# Patient Record
Sex: Female | Born: 1980 | Hispanic: No | Marital: Married | Smoking: Never smoker
Health system: Southern US, Community
[De-identification: ages and names within clinical notes are randomized; demographics above are authoritative.]

## PROBLEM LIST (undated history)

## (undated) ENCOUNTER — Inpatient Hospital Stay (HOSPITAL_COMMUNITY): Payer: Self-pay

## (undated) DIAGNOSIS — K649 Unspecified hemorrhoids: Secondary | ICD-10-CM

## (undated) HISTORY — PX: CHOLECYSTECTOMY: SHX55

---

## 2008-05-27 ENCOUNTER — Ambulatory Visit: Payer: Self-pay | Admitting: Obstetrics and Gynecology

## 2008-05-28 ENCOUNTER — Encounter: Payer: Self-pay | Admitting: Obstetrics and Gynecology

## 2008-05-28 LAB — CONVERTED CEMR LAB
Eosinophils Relative: 3 % (ref 0–5)
HCT: 37 % (ref 36.0–46.0)
Hemoglobin: 11.7 g/dL — ABNORMAL LOW (ref 12.0–15.0)
Hepatitis B Surface Ag: NEGATIVE
Hgb A: 97.8 % (ref 96.8–97.8)
Hgb F Quant: 0 % (ref 0.0–2.0)
Hgb S Quant: 0 % (ref 0.0–0.0)
Lymphocytes Relative: 26 % (ref 12–46)
Lymphs Abs: 2.4 10*3/uL (ref 0.7–4.0)
Neutro Abs: 6.2 10*3/uL (ref 1.7–7.7)
Neutrophils Relative %: 66 % (ref 43–77)
Rubella: 115.8 intl units/mL — ABNORMAL HIGH
WBC: 9.4 10*3/uL (ref 4.0–10.5)

## 2008-06-21 ENCOUNTER — Ambulatory Visit: Payer: Self-pay | Admitting: Family

## 2008-06-25 ENCOUNTER — Ambulatory Visit (HOSPITAL_COMMUNITY): Admission: RE | Admit: 2008-06-25 | Discharge: 2008-06-25 | Payer: Self-pay | Admitting: Obstetrics & Gynecology

## 2008-07-19 ENCOUNTER — Ambulatory Visit: Payer: Self-pay | Admitting: Family

## 2008-08-16 ENCOUNTER — Ambulatory Visit: Payer: Self-pay | Admitting: Family

## 2008-09-13 ENCOUNTER — Ambulatory Visit: Payer: Self-pay | Admitting: Obstetrics and Gynecology

## 2008-09-13 LAB — CONVERTED CEMR LAB
HCT: 33.4 % — ABNORMAL LOW (ref 36.0–46.0)
Hemoglobin: 10.5 g/dL — ABNORMAL LOW (ref 12.0–15.0)
MCHC: 31.4 g/dL (ref 30.0–36.0)
RBC: 3.89 M/uL (ref 3.87–5.11)
WBC: 11.8 10*3/uL — ABNORMAL HIGH (ref 4.0–10.5)

## 2008-09-24 ENCOUNTER — Inpatient Hospital Stay (HOSPITAL_COMMUNITY): Admission: AD | Admit: 2008-09-24 | Discharge: 2008-09-24 | Payer: Self-pay | Admitting: Obstetrics & Gynecology

## 2008-09-24 ENCOUNTER — Ambulatory Visit: Payer: Self-pay | Admitting: Advanced Practice Midwife

## 2008-09-27 ENCOUNTER — Ambulatory Visit: Payer: Self-pay | Admitting: Physician Assistant

## 2008-10-04 ENCOUNTER — Encounter: Payer: Self-pay | Admitting: Physician Assistant

## 2008-10-11 ENCOUNTER — Ambulatory Visit: Payer: Self-pay | Admitting: Physician Assistant

## 2008-10-25 ENCOUNTER — Ambulatory Visit: Payer: Self-pay | Admitting: Physician Assistant

## 2008-10-25 ENCOUNTER — Ambulatory Visit (HOSPITAL_COMMUNITY): Admission: RE | Admit: 2008-10-25 | Discharge: 2008-10-25 | Payer: Self-pay | Admitting: Obstetrics & Gynecology

## 2008-10-26 ENCOUNTER — Encounter: Payer: Self-pay | Admitting: Physician Assistant

## 2008-10-28 ENCOUNTER — Ambulatory Visit: Payer: Self-pay | Admitting: Family

## 2008-11-01 ENCOUNTER — Ambulatory Visit: Payer: Self-pay | Admitting: Obstetrics and Gynecology

## 2008-11-06 ENCOUNTER — Inpatient Hospital Stay (HOSPITAL_COMMUNITY): Admission: AD | Admit: 2008-11-06 | Discharge: 2008-11-08 | Payer: Self-pay | Admitting: Obstetrics and Gynecology

## 2008-11-06 ENCOUNTER — Ambulatory Visit: Payer: Self-pay | Admitting: Physician Assistant

## 2008-12-06 ENCOUNTER — Ambulatory Visit: Payer: Self-pay | Admitting: Obstetrics and Gynecology

## 2008-12-06 LAB — CONVERTED CEMR LAB
AST: 61 units/L — ABNORMAL HIGH (ref 0–37)
Albumin: 4.5 g/dL (ref 3.5–5.2)
Amylase: 69 units/L (ref 0–105)
BUN: 15 mg/dL (ref 6–23)
CO2: 21 meq/L (ref 19–32)
Glucose, Bld: 81 mg/dL (ref 70–99)
MCHC: 30 g/dL (ref 30.0–36.0)
Potassium: 4.7 meq/L (ref 3.5–5.3)
RDW: 17.1 % — ABNORMAL HIGH (ref 11.5–15.5)
Sodium: 140 meq/L (ref 135–145)
Total Bilirubin: 0.5 mg/dL (ref 0.3–1.2)

## 2008-12-20 ENCOUNTER — Ambulatory Visit: Payer: Self-pay | Admitting: Physician Assistant

## 2008-12-30 ENCOUNTER — Ambulatory Visit: Payer: Self-pay | Admitting: Family Medicine

## 2008-12-30 DIAGNOSIS — R1013 Epigastric pain: Secondary | ICD-10-CM | POA: Insufficient documentation

## 2008-12-31 ENCOUNTER — Encounter: Admission: RE | Admit: 2008-12-31 | Discharge: 2008-12-31 | Payer: Self-pay | Admitting: Family Medicine

## 2008-12-31 DIAGNOSIS — K8020 Calculus of gallbladder without cholecystitis without obstruction: Secondary | ICD-10-CM | POA: Insufficient documentation

## 2009-01-01 ENCOUNTER — Telehealth: Payer: Self-pay | Admitting: Family Medicine

## 2009-01-08 ENCOUNTER — Encounter: Payer: Self-pay | Admitting: Family Medicine

## 2009-04-18 ENCOUNTER — Ambulatory Visit: Payer: Self-pay | Admitting: Family Medicine

## 2009-10-22 ENCOUNTER — Ambulatory Visit: Payer: Self-pay | Admitting: Family Medicine

## 2009-10-22 DIAGNOSIS — H6690 Otitis media, unspecified, unspecified ear: Secondary | ICD-10-CM | POA: Insufficient documentation

## 2009-10-28 ENCOUNTER — Telehealth: Payer: Self-pay | Admitting: Family Medicine

## 2009-12-15 ENCOUNTER — Ambulatory Visit: Payer: Self-pay | Admitting: Family Medicine

## 2009-12-15 DIAGNOSIS — J309 Allergic rhinitis, unspecified: Secondary | ICD-10-CM | POA: Insufficient documentation

## 2009-12-15 DIAGNOSIS — H6120 Impacted cerumen, unspecified ear: Secondary | ICD-10-CM | POA: Insufficient documentation

## 2010-01-18 ENCOUNTER — Telehealth (INDEPENDENT_AMBULATORY_CARE_PROVIDER_SITE_OTHER): Payer: Self-pay | Admitting: *Deleted

## 2010-01-18 ENCOUNTER — Ambulatory Visit: Payer: Self-pay | Admitting: Emergency Medicine

## 2010-01-18 DIAGNOSIS — H60399 Other infective otitis externa, unspecified ear: Secondary | ICD-10-CM | POA: Insufficient documentation

## 2010-03-22 ENCOUNTER — Encounter: Payer: Self-pay | Admitting: Obstetrics & Gynecology

## 2010-03-30 ENCOUNTER — Ambulatory Visit
Admission: RE | Admit: 2010-03-30 | Discharge: 2010-03-30 | Payer: Self-pay | Source: Home / Self Care | Attending: Family Medicine | Admitting: Family Medicine

## 2010-03-30 DIAGNOSIS — J019 Acute sinusitis, unspecified: Secondary | ICD-10-CM | POA: Insufficient documentation

## 2010-03-31 NOTE — Progress Notes (Signed)
Summary: Ear pain  Phone Note Call from Patient   Caller: Spouse Summary of Call: Pt has been taking ABX for 4 days but is still having ear pain. She has been taking IBU every 6 hours but it is not helping. Pt would like to know if you can send something for pain to pharm. Please advise. Initial call taken by: Payton Spark CMA,  October 28, 2009 9:21 AM    New/Updated Medications: ACETAMINOPHEN-CODEINE #3 300-30 MG TABS (ACETAMINOPHEN-CODEINE) 1 tab by mouth q 4-6 hrs as needed pain; take with food Prescriptions: ACETAMINOPHEN-CODEINE #3 300-30 MG TABS (ACETAMINOPHEN-CODEINE) 1 tab by mouth q 4-6 hrs as needed pain; take with food  #24 x 0   Entered and Authorized by:   Seymour Bars DO   Signed by:   Seymour Bars DO on 10/28/2009   Method used:   Printed then faxed to ...       [address] [phone]* (retail)       [address] [place], [place]  [address]       Ph: [phone]       Fax: [phone]   RxID:   [id number]   Appended Document: Ear pain LMOM informing Pt of the above

## 2010-03-31 NOTE — Progress Notes (Signed)
Summary: RX verification  Phone Note From Pharmacy   Summary of Call: [facility] [place], [place] called to verify RX for pt.s ear, RX states ointment and directions are for drops. Please verify and send appropiate RX to [facility]. Joanne Chars CMA  January 18, 2010 4:13 PM   Follow-up for Phone Call        Yes, I meant solution, not ointment

## 2010-03-31 NOTE — Assessment & Plan Note (Signed)
Summary: EAR PAIN/TM   Vital Signs:  Patient Profile:   30 Years Old Female CC:      Rt ear pain x 3 days Height:     64 inches Weight:      153 pounds O2 Sat:      99 % O2 treatment:    Room Air Temp:     98.7 degrees F oral Pulse rate:   62 / minute Pulse rhythm:   regular Resp:     12 per minute BP sitting:   111 / 70  (left arm) Cuff size:   regular  Vitals Entered By: Emilio Math (January 18, 2010 1:24 PM)                  Current Allergies (reviewed today): No known allergies History of Present Illness Chief Complaint: Rt ear pain x 3 days History of Present Illness: R ear pain for 3 days.  She had something similar a few months ago, and when ears were flushed she felt better.  Patient with HA, sinus congestion.  Hurts to touch ear.  No drainage.  Current Meds TYLENOL 325 MG TABS (ACETAMINOPHEN) 1 prn IBUPROFEN 200 MG TABS (IBUPROFEN) 1-2 prn CORTISPORIN 1 % OINT (BACIT-POLY-NEO HC) 4 drops R ear two times a day for 10 days  REVIEW OF SYSTEMS Constitutional Symptoms      Denies fever, chills, night sweats, weight loss, weight gain, and fatigue.  Eyes       Denies change in vision, eye pain, eye discharge, glasses, contact lenses, and eye surgery. Ear/Nose/Throat/Mouth       Complains of ear pain.      Denies hearing loss/aids, change in hearing, ear discharge, dizziness, frequent runny nose, frequent nose bleeds, sinus problems, sore throat, hoarseness, and tooth pain or bleeding.  Respiratory       Denies dry cough, productive cough, wheezing, shortness of breath, asthma, bronchitis, and emphysema/COPD.  Cardiovascular       Denies murmurs, chest pain, and tires easily with exhertion.    Gastrointestinal       Denies stomach pain, nausea/vomiting, diarrhea, constipation, blood in bowel movements, and indigestion. Genitourniary       Denies painful urination, kidney stones, and loss of urinary control. Neurological       Complains of headaches.       Denies paralysis, seizures, and fainting/blackouts. Musculoskeletal       Denies muscle pain, joint pain, joint stiffness, decreased range of motion, redness, swelling, muscle weakness, and gout.  Skin       Denies bruising, unusual mles/lumps or sores, and hair/skin or nail changes.  Psych       Denies mood changes, temper/anger issues, anxiety/stress, speech problems, depression, and sleep problems.  Past History:  Past Medical History: Reviewed history from 12/30/2008 and no changes required. G2P2  Past Surgical History:  Cholecystectomy  Family History: Reviewed history from 12/30/2008 and no changes required. [place]rill  Social History: Reviewed history from 12/30/2008 and no changes required. 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  Never smoked.  Denies ETOH. Does not exercise. Physical Exam General appearance: well developed, well nourished, no acute distress Nasal: mucosa pink, nonedematous, no septal deviation, turbinates normal Oral/Pharynx: tongue normal, posterior pharynx without erythema or exudate Chest/Lungs: no rales, wheezes, or rhonchi bilateral, breath sounds equal without effort Heart: regular rate and  rhythm, no murmur Skin: no obvious rashes or lesions MSE: oriented to time, place, and person L ear normal R ear obstructed by cerumen  and purulent discharge.  Tragus TTP.  After flushing, a lot of cerumen and pus comes out.  TM is intact and no erythema.  Canal still with mild swelling. Assessment New Problems: CERUMEN IMPACTION, RIGHT (ICD-380.4) OTITIS EXTERNA, RIGHT (ICD-380.10)   Patient Education: Patient and/or caregiver instructed in the following: rest, fluids, Ibuprofen prn.  Plan New Medications/Changes: CORTISPORIN 1 % OINT (BACIT-POLY-NEO HC) 4 drops R ear two times a day for 10 days  #QS x 0, 01/18/2010, Hoyt Koch MD  New Orders: New Patient Level III [phone] Cerumen Impaction Removal [69210] Planning Comments:   Use eardrops as directed If  not improving, follow up with PCP or ENT Tylenol/Ibuprofen for pain   The patient and/or caregiver has been counseled thoroughly with regard to medications prescribed including dosage, schedule, interactions, rationale for use, and possible side effects and they verbalize understanding.  Diagnoses and expected course of recovery discussed and will return if not improved as expected or if the condition worsens. Patient and/or caregiver verbalized understanding.  Prescriptions: CORTISPORIN 1 % OINT (BACIT-POLY-NEO HC) 4 drops R ear two times a day for 10 days  #QS x 0   Entered and Authorized by:   Hoyt Koch MD   Signed by:   Hoyt Koch MD on 01/18/2010   Method used:   Print then Give to Patient   RxID:   [id number]   Orders Added: 1)  New Patient Level III [95621] 2)  Cerumen Impaction Removal [30865]

## 2010-03-31 NOTE — Assessment & Plan Note (Signed)
Summary: AOM   Vital Signs:  Patient profile:   30 year old female Height:      64 inches Weight:      156 pounds BMI:     26.87 O2 Sat:      98 % on Room air Temp:     98.1 degrees F oral Pulse rate:   63 / minute BP sitting:   101 / 66  (left arm) Cuff size:   regular  Vitals Entered By: Payton Spark CMA (December 15, 2009 8:25 AM)  O2 Flow:  Room air CC: L ear infection x days.   Primary Care Provider:  Seymour Bars DO  CC:  L ear infection x days.Marland Kitchen  History of Present Illness: 30 yo F presents for L ear pain x 1-2 wks.  Has constant pain in the ear and she feels like something is in her ear.  She has some ringing in the ear.  Has some muffled hearing and some dizziness.  She has also has head congestion..  No sore throat, fevers or chills.  Denies any dental pain.  Has some fatigue.    Current Medications (verified): 1)  None  Allergies (verified): No Known Drug Allergies  Past History:  Past Medical History: Reviewed history from 12/30/2008 and no changes required. G2P2  Social History: Reviewed history from 12/30/2008 and no changes required. 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  Never smoked.  Denies ETOH. Does not exercise.  Review of Systems      See HPI  Physical Exam  General:  alert, well-developed, well-nourished, and well-hydrated.   Head:  normocephalic and atraumatic.  sinuses NTTP Eyes:  allergic shiners; conjunctiva clear; no lid edema Ears:  bilat cerumen impaction, deep.  no external tenderness.  obstructed TMs. successfully irrigated the L side --> purulence at the TM, poor bony landmarks Nose:  nasal congestion with clear rhinorrhea and boggy turbinates Mouth:  2+ tonsilar hypertrophy with slight injection and cobblestoning Neck:  no masses.   Lungs:  Normal respiratory effort, chest expands symmetrically. Lungs are clear to auscultation, no crackles or wheezes. Heart:  Normal rate and regular rhythm. S1 and S2 normal without gallop, murmur,  click, rub or other extra sounds. Skin:  color normal and no rashes.   Cervical Nodes:  No lymphadenopathy noted   Impression & Recommendations:  Problem # 1:  OTITIS MEDIA, LEFT (ICD-382.9) L AOM present after cerumen irrigated.  This is her 2nd AOM this year.  Will refer to ENT and treat current infection with Cefdinir and Ibuprofen.   The following medications were removed from the medication list:    Amoxicillin-pot Clavulanate 875-125 Mg Tabs (Amoxicillin-pot clavulanate) .Marland Kitchen... 1 tab by mouth q 12 hrs x 10 days; take with food Her updated medication list for this problem includes:    Cefdinir 300 Mg Caps (Cefdinir) .Marland Kitchen... 1 capsule by mouth two times a day x 10 days  Problem # 2:  CERUMEN IMPACTION, BILATERAL (ICD-380.4) Successfully irrigated L side but deep impaction remains on the R.  Avoid Q tips. ENT referral made for microscopic removal.  Problem # 3:  ALLERGIC RHINITIS CAUSE UNSPECIFIED (ICD-477.9) Recommend  claritin D once daily thru Fall.  Complete Medication List: 1)  Claritin-d 24 Hour 10-240 Mg Xr24h-tab (Loratadine-pseudoephedrine) .Marland Kitchen.. 1 tab by mouth qam 2)  Cefdinir 300 Mg Caps (Cefdinir) .Marland Kitchen.. 1 capsule by mouth two times a day x 10 days  Patient Instructions: 1)  Take Cefdinir 1 capsule 2 x a day (with breakfast  and dinner) x 10 days for L ear infection. 2)  Referral to ENT made for recurrent ear infections and wax impaction on the R. 3)  Take OTC Claritin D daily for allergies thru Fall. 4)  Use Advil as needed for ear pain. Prescriptions: CEFDINIR 300 MG CAPS (CEFDINIR) 1 capsule by mouth two times a day x 10 days  #20 x 0   Entered and Authorized by:   Seymour Bars DO   Signed by:   Seymour Bars DO on 12/15/2009   Method used:   Electronically to        [facility] [phone]* (retail)       [address] [place], [place]  [address]       Ph: [phone]       Fax: [phone]   RxID:   [id number]    Orders Added: 1)  Est. Patient Level IV  [[address]]  Appended Document: AOM

## 2010-03-31 NOTE — Assessment & Plan Note (Signed)
Summary: AOM   Vital Signs:  Patient profile:   30 year old female Height:      64 inches Weight:      157 pounds BMI:     27.05 O2 Sat:      99 % on Room air Temp:     98.5 degrees F oral Pulse rate:   84 / minute BP sitting:   96 / 65  (left arm) Cuff size:   regular  Vitals Entered By: Payton Spark CMA (October 22, 2009 2:38 PM)  O2 Flow:  Room air CC: Ear pain x 1 week   Primary Care Provider:  Seymour Bars DO  CC:  Ear pain x 1 week.  History of Present Illness: R>L ear pain on and off thru the years but worse the past wk.  She has some head congestion and sore throat.  She has not been diagnosed with allergies.    Denies fevers, chills or drainage from her ears.  Denies dizziness but has muffled hearing.  No tinnitus.  Allergies: No Known Drug Allergies  Past History:  Past Medical History: Reviewed history from 12/30/2008 and no changes required. G2P2  Social History: Reviewed history from 12/30/2008 and no changes required. 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  Never smoked.  Denies ETOH. Does not exercise.  Review of Systems      See HPI  Physical Exam  General:  alert, well-developed, well-nourished, and well-hydrated.   Head:  normocephalic and atraumatic.   Eyes:  sclera non icteric strabismus Ears:  R TM appears opague with poor light reflex and L TM is patent with purulent drainage behind the TM with fluid bubbles Nose:  nasal congestion with boggy turbinates present Mouth:  pharynx pink and moist.   Neck:  no masses.   Lungs:  Normal respiratory effort, chest expands symmetrically. Lungs are clear to auscultation, no crackles or wheezes. Heart:  Normal rate and regular rhythm. S1 and S2 normal without gallop, murmur, click, rub or other extra sounds. Skin:  color normal.     Impression & Recommendations:  Problem # 1:  OTITIS MEDIA, ACUTE, BILATERAL (ICD-382.9) Treat with 10 days of Augmentin.  Use Ibuprofen or Tylenol as needed pain.  Recheck in  2 wks.  Call if any problems. Her updated medication list for this problem includes:    Amoxicillin-pot Clavulanate 875-125 Mg Tabs (Amoxicillin-pot clavulanate) .Marland Kitchen... 1 tab by mouth q 12 hrs x 10 days; take with food  Complete Medication List: 1)  Amoxicillin-pot Clavulanate 875-125 Mg Tabs (Amoxicillin-pot clavulanate) .Marland Kitchen.. 1 tab by mouth q 12 hrs x 10 days; take with food  Patient Instructions: 1)  Take antibiotics with breakfast and dinner x 10 days for ear infection. 2)  Use Advil- 2-3 tabs up to 3 x a day for ear pain/ muscle aches. 3)  Returrn to recheck ears in 2 wks. Prescriptions: AMOXICILLIN-POT CLAVULANATE 875-125 MG TABS (AMOXICILLIN-POT CLAVULANATE) 1 tab by mouth q 12 hrs x 10 days; take with food  #20 x 0   Entered and Authorized by:   Seymour Bars DO   Signed by:   Seymour Bars DO on 10/22/2009   Method used:   Electronically to        [facility] # [phone]* (retail)       [address]       [place], [place]  [address]       Ph: [phone]  Fax: [phone]   RxID:   [id number]

## 2010-03-31 NOTE — Assessment & Plan Note (Signed)
Summary: needs immunizations   Vital Signs:  Patient profile:   30 year old female Height:      64 inches Weight:      160 pounds BMI:     27.56 O2 Sat:      100 % on Room air Temp:     98.2 degrees F oral Pulse rate:   72 / minute BP sitting:   103 / 68  (right arm) Cuff size:   large  Vitals Entered By: Payton Spark CMA/April (April 18, 2009 8:35 AM)  O2 Flow:  Room air CC: immun[place] the country.   Primary Care Provider:  Seymour Bars DO  CC:  immunizations for going out of the country.Marland Kitchen  History of Present Illness: 30 yo [place] female presents for immunizations.  She is going to [place] in 3 wks for a 1month trip.      Current Medications (verified): 1)  None  Allergies (verified): No Known Drug Allergies   Other Orders: Tdap => 80yrs IM (78295) Meningococcal Vaccine [place] (62130) Admin 1st Vaccine [phone]) Admin of Any Addtl Vaccine [phone]) Admin 1st Vaccine (State) [phone]) Admin of Any Addtl Vaccine (State) [phone]) Admin 1st Vaccine (02725) Flu Vaccine 90yrs + (36644)   Tetanus/Td Vaccine    Vaccine Type: Tdap    Site: left deltoid    Mfr: GlaxoSmithKline    Dose: 0.5 ml    Route: IM    Given by: Payton Spark CMA    Exp. Date: 12/25/2009    Lot #: [id number]    VIS given: 01/17/07 version given April 18, 2009.  Meningococcal Vaccine    Vaccine Type: Meningococcal    Site: right deltoid    Dose: 0.5 ml    Route: IM    Given by: Payton Spark CMA    Exp. Date: 06/24/2010    Lot #: [id number]    VIS given: 03/28/06 version given April 18, 2009. Flu Vaccine Consent Questions     Do you have a history of severe allergic reactions to this vaccine? no    Any prior history of allergic reactions to egg and/or gelatin? no    Do you have a sensitivity to the preservative Thimersol? no    Do you have a past history of Guillan-Barre Syndrome? no    Do you currently have an acute febrile illness? no    Have you ever had a  severe reaction to latex? no    Vaccine information given and explained to patient? yes    Are you currently pregnant? no    Lot Number:[id number]   Exp Date:08/28/2009   Site Given  Left Deltoid IMen by: Payton Spark CMA    Exp. Date: 06/24/2010    Lot #: [id number]    VIS given: 03/28/06 version given April 18, 2009. Marland Kitchenlbflu

## 2010-04-08 NOTE — Assessment & Plan Note (Signed)
Summary: sinusitis   Vital Signs:  Patient profile:   30 year old female Height:      64 inches Weight:      156 pounds BMI:     26.87 O2 Sat:      98 % on Room air Temp:     98.6 degrees F oral Pulse rate:   77 / minute BP sitting:   115 / 55  (left arm) Cuff size:   large  Vitals Entered By: Payton Spark CMA (March 30, 2010 9:16 AM)  O2 Flow:  Room air CC: Sinus congestion and slight cough x 1 month. OTC meds not helping.    Primary Care Provider:  Seymour Bars DO  CC:  Sinus congestion and slight cough x 1 month. OTC meds not helping. Marland Kitchen  History of Present Illness: 30 yo F presents for congestion with slight cough and off for the past 1-2 mos.  She is taking OTC Mucinex but it's not helping much.  Has HAs and sore throat.  She has pain and pressure behind her eyes.  No fever.  Has a lot of postnasal drip.  She feels more tired.  She is a little queasy.  No V/D.  No ear pain or pressure.  Has some rhinorrhea.  She has some chest tightness.    She never had asthma and never smoked.  Never had allergies.  Denies occular symptoms or sneezing.    Current Medications (verified): 1)  None  Allergies (verified): No Known Drug Allergies  Past History:  Past Medical History: Reviewed history from 12/30/2008 and no changes required. G2P2  Social History: Reviewed history from 12/30/2008 and no changes required. 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  Never smoked.  Denies ETOH. Does not exercise.  Review of Systems      See HPI  Physical Exam  General:  alert, well-developed, well-nourished, well-hydrated, and overweight-appearing.   Head:  normocephalic and atraumatic.  frontal sinus TTP Eyes:  conjunctiva clear; allergic shiners present Ears:  EACs patent; TMs translucent and gray with good cone of light and bony landmarks.  Nose:  boggy turbinates with nasal congestion Mouth:  o/p mildly injected Neck:  no masses.   Lungs:  Normal respiratory effort, chest expands  symmetrically. Lungs are clear to auscultation, no crackles or wheezes. Heart:  Normal rate and regular rhythm. S1 and S2 normal without gallop, murmur, click, rub or other extra sounds. Skin:  color normal.   Cervical Nodes:  No lymphadenopathy noted   Impression & Recommendations:  Problem # 1:  ACUTE SINUSITIS, UNSPECIFIED (ICD-461.9) Will treat for ABS with Amox x 10 days + Advil Cold and Sinus.  Call if not improved after tx is complete. Her updated medication list for this problem includes:    Amoxicillin 500 Mg Caps (Amoxicillin) .Marland Kitchen... 1 capsule by mouth three times a day x 10 days  Complete Medication List: 1)  Amoxicillin 500 Mg Caps (Amoxicillin) .Marland Kitchen.. 1 capsule by mouth three times a day x 10 days  Patient Instructions: 1)  Take Amoxicillin 3 x a day (with breakfast, lunch and dinner) for SINUSITIS. 2)  Use OTC ADVIL COLD AND SINUS for symptomatic releif. 3)  Expect to see improvements in the next 7-10 days. 4)  Call if not improving. Prescriptions: AMOXICILLIN 500 MG CAPS (AMOXICILLIN) 1 capsule by mouth three times a day x 10 days  #30 x 0   Entered and Authorized by:   Seymour Bars DO   Signed by:   Clydie Braun  Charmagne Buhl DO on 03/30/2010   Method used:   Electronically to        [facility] [phone]* (retail)       [address] [place], [place]  [address]       Ph: [phone]       Fax: [phone]   RxID:   [id number]    Orders Added: 1)  Est. Patient Level III [phone]

## 2010-06-05 LAB — CBC
Hemoglobin: 10.9 g/dL — ABNORMAL LOW (ref 12.0–15.0)
Platelets: 247 10*3/uL (ref 150–400)
RDW: 15.6 % — ABNORMAL HIGH (ref 11.5–15.5)
WBC: 13.9 10*3/uL — ABNORMAL HIGH (ref 4.0–10.5)

## 2010-06-05 LAB — GLUCOSE, CAPILLARY: Glucose-Capillary: 95 mg/dL (ref 70–99)

## 2010-06-05 LAB — GLUCOSE, RANDOM: Glucose, Bld: 99 mg/dL (ref 70–99)

## 2010-06-07 LAB — URINALYSIS, ROUTINE W REFLEX MICROSCOPIC
Bilirubin Urine: NEGATIVE
Glucose, UA: NEGATIVE mg/dL
Ketones, ur: NEGATIVE mg/dL
Specific Gravity, Urine: 1.015 (ref 1.005–1.030)
pH: 7 (ref 5.0–8.0)

## 2010-07-14 NOTE — Assessment & Plan Note (Signed)
[name], [name]                  ACCOUNT NO.:  [id number]   MEDICAL RECORD NO.:  [id number]          PATIENT TYPE:  POB   LOCATION:  [facility] [place]         FACILITY:  [facility]   PHYSICIAN:  Maylon Cos, CNM    DATE OF BIRTH:  09/19/1980   DATE OF SERVICE:  12/19/2008                                  CLINIC NOTE   The patient presented today 12/19/2008 to the [facility].   CHIEF COMPLAINT:  Six weeks postpartum visit.   HISTORY OF PRESENT ILLNESS:  The patient is 6 weeks postpartum from a  normal spontaneous vaginal delivery at 37 weeks and 3 days.  She was  delivered by myself under epidural anesthesia.  She delivered a term  female infant who is viable weighing 7 pounds 3 ounces without  complication.  Apgars were 9 and 9.  She did have a second-degree  laceration that was repaired.  She was breast and bottle feeding at the  time of discharge and she had a hemoglobin of 10.9.  She presents today  with complaints of rectal pain that lasts for 2-3 hours after  defecation.  She does state that she does not have constipation.  She  never had problems with hemorrhoids before, but she does state that this  seems to be a problem now.  She has continued to breast and bottle feed.  She is breast feeding approximately 4-5 times a day.  She is concerned  about weight gain and states that she was told that drinking too much  fluid a day will cause her to have weight gain in her abdomen, and for  this reason, she is drinking minimal amounts throughout the course of  the day.  She was seen 2 weeks ago by Kim [name] here in our office  with complaints of epigastric pain that she has had prior to pregnancy  and has returned since delivery.  She says that she has again had this  pain once last week that was severe and sudden and sharp in its onset  that lasted for about an hour causing her to double over.  She tried  antacids, it did not really cause her any relief.  She had to  change her  positions.  She really cannot relate it to her fluid intake.  It did  cause her nausea and vomiting.  She vomited 4 times, but the emesis she  states is normal, it resolved on its unknown.  At the time of her visit,  Kim [name] did check some labs, LFTs on December 09, 2008, were noted to be  elevated, her AST is 61 and her SGPT is 102, her alkaline phosphatase  was found to be 138.  All other labs were normal.  Hemoglobin at that  time has returned to normal since delivery and is 11.5.   PHYSICAL EXAMINATION:  VITAL SIGNS:  Today are stable.  Her blood  pressure is 104/68.  Her weight is 173 down from 201 at her last  prenatal visit.  GENERAL:  Kim [name] is a pleasant Kim [name] female who is in no apparent  distress.  She appears to be comfortable.  HEENT:  Exam is  grossly normal.  ABDOMEN:  Exam is benign.  She is nontender on palpation.  She has bowel  sounds in all 4 quadrants.  No masses detected on palpation.  GENITALIA:  Examination of the genitalia revealed that her second-degree  laceration is well-healed.  RECTAL:  Internal exam is deferred, however,  she does have small hemorrhoids noted externally.  EXTREMITIES:  Warm to the touch with even hair distribution.   ASSESSMENT AND PLAN:  1. Six weeks postpartum, status post normal spontaneous vaginal      delivery of a female infant.  She has done well from a postpartum      standpoint, and she continued her prenatal vitamins during the      duration of her breast feeding of her infant.  She is encouraged to      increase her p.o. intake of fluids, nutritional counseling is done.      She has no limitations on her activity and may resume sexual      intercourse.  2. Abdominal pain with elevated LFTs.  I have ordered an abdominal      ultrasound to look at her gallbladder.  It has been less than 6      hours since the patient has eaten and for this reason, we are      unable to obtain this ultrasound today and the patient is  not in      pain today.  We will order this for Monday.  The patient is      instructed to begin a low-fat diet and given instructions the      patient will return over the weekend.  If severe with vomiting, she      should return to the Urgent Care or be seen in the emergency room      for evaluation.  3. Contraception.  Options of hormonal contraception given her desire      to child space for 2 years were reviewed.  The patient does decline      hormonal contraception at this time and desires the use of condoms      and this is what she has used with her husband in the past.  4. Health maintenance.  The patient should return in January for her      annual physical given her age and her history of normal Pap smears.      She is not due for Pap smear until January 2011.  Nutrition, weight      management, and exercise were reviewed along with daily      multivitamin, calcium.           ______________________________  Maylon Cos, CNM     SS/MEDQ  D:  12/20/2008  T:  12/21/2008  Job:  [id number]

## 2010-07-14 NOTE — Assessment & Plan Note (Signed)
Kim, [name]                  ACCOUNT NO.:  [id number]   MEDICAL RECORD NO.:  [id number]          PATIENT TYPE:  POB   LOCATION:  [facility] at [place]         FACILITY:  [facility]   PHYSICIAN:  Caren Griffins, CNM       DATE OF BIRTH:  May 15, 1980   DATE OF SERVICE:  12/06/2008                                  CLINIC NOTE   HISTORY:  This is a 30 year old, G3, P2-0-1-2, who is now 4 weeks post  NSVD presenting with a chief complaint of epigastric abdominal pain.   HISTORY OF PRESENT ILLNESS:  She has had 3-4 episodes of severe  epigastric pain.  She believes the first one occurred around the month  before she delivered.  The most recent one was yesterday.  She says the  pain is severe, sharp unremitting, and incapacitates her for about an  hour.  She has been doubled over in pain.  She tried antacids and Pepcid  and got no relief from that.  On one occasion, moving and changing her  position coincided with resolution of the pain.  She cannot relate it to  any particular food intake, but has not paid close attention to that.  She has had no nausea, vomiting, diarrhea, or anorexia.  Bowel movements  are normal.  She denies any burning sensation or GERD symptoms.  The  pain usually radiates straight to her back and she states it usually  last for 1-1/2 hours.   OBJECTIVE:  VITAL SIGNS:  BP 106/66, pulse 67, weight 176.  GENERAL:  WN, WD, NAD.  ABDOMEN:  Soft, essentially nontender.  Bowel sounds normal.  No  organomegaly or masses.  She is subjectively locating the pain in the  mid-epigastrium, but on deep palpation, she is nontender.  No guarding.  No rebound.   ASSESSMENT:  Normal postpartum, severe intermittent epigastric pain  could represent abdominus rectus muscle spasms, however, location  unusual for that, could be GERD or gastritis.   PLAN:  We will go ahead and get CBC, Chem 24, amylase, and lipase.  Labs  today and when she returns in 2 weeks for her postpartum visits, we  will  see how she is doing.  She is advised to keep a diary of the pain  including when it occurs, how long it lasts, and what exacerbates and  relieves the pain.     ______________________________  Caren Griffins, CNM    ______________________________  Caren Griffins, CNM    DP/MEDQ  D:  12/06/2008  T:  12/07/2008  Job:  [id number]

## 2011-03-11 IMAGING — US US ABDOMEN COMPLETE
1 series · 14 of 25 positions shown · non-contrast
Comparison: None

CLINICAL DATA: Right upper quadrant pain, nausea and vomiting over
3 months

COMPLETE ABDOMINAL ULTRASOUND

[Series 1: us abdomen complete · 0.26mm/px · 14 of 93 slices shown]
[im 1/93]
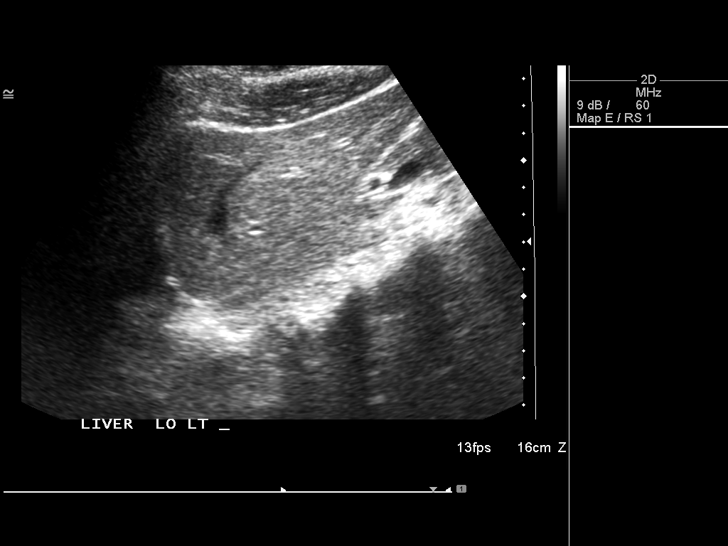
[im 8/93]
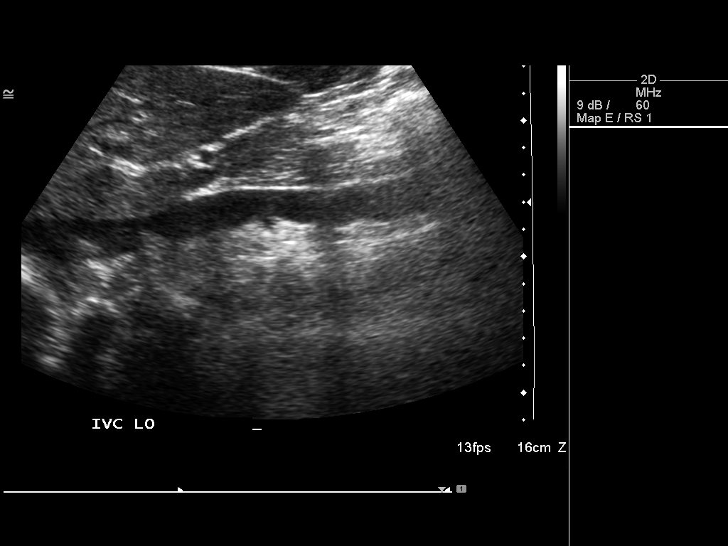
[im 16/93]
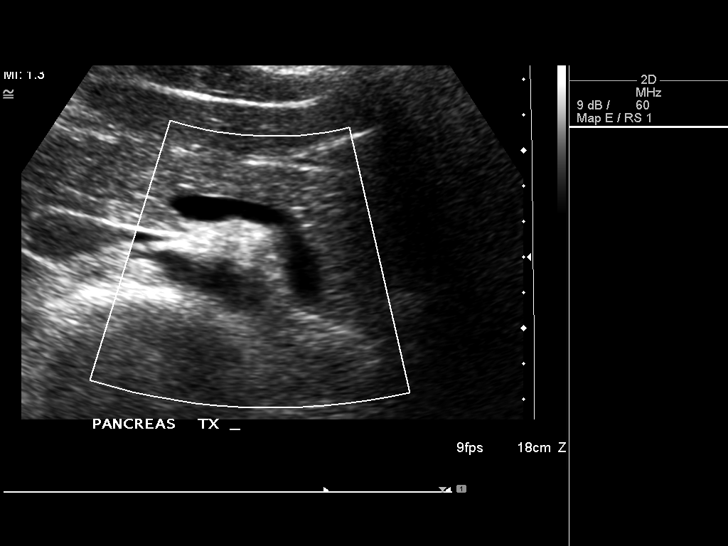
[im 24/93]
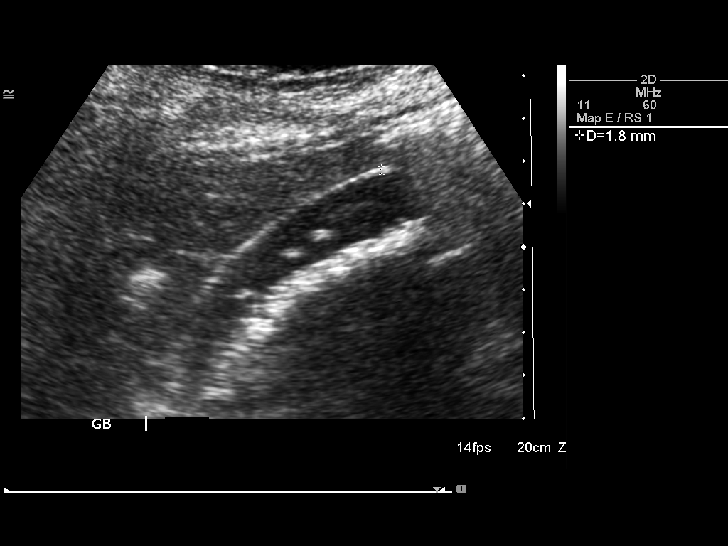
[im 31/93]
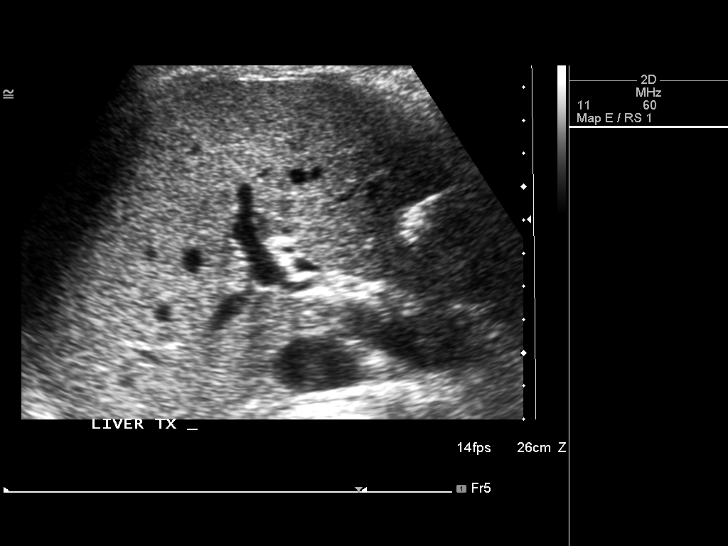
[im 35/93]
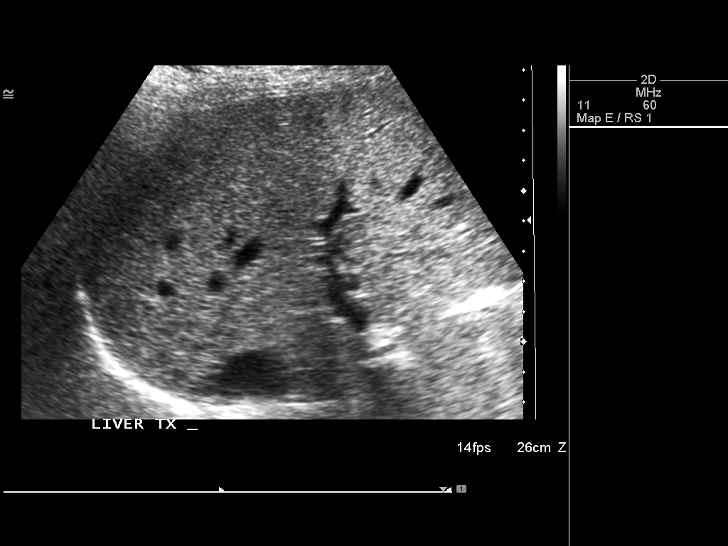
[im 43/93]
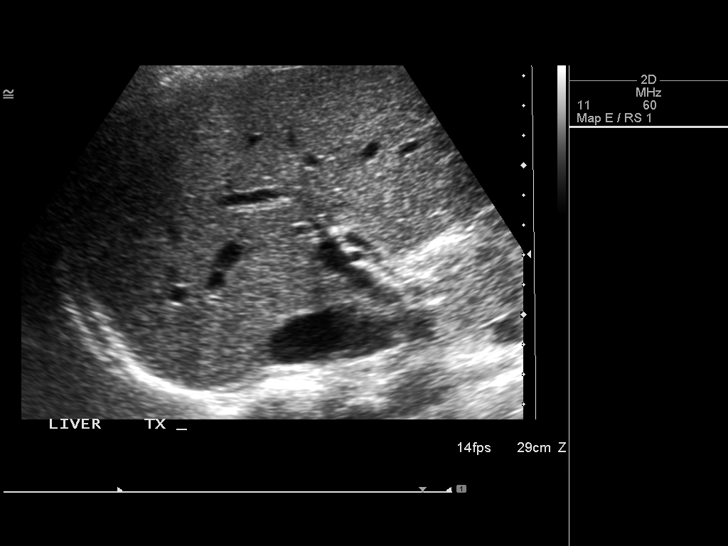
[im 50/93]
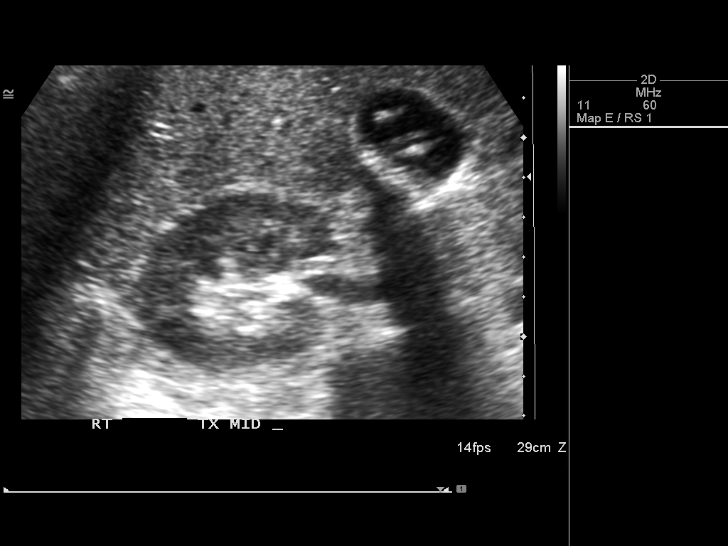
[im 58/93]
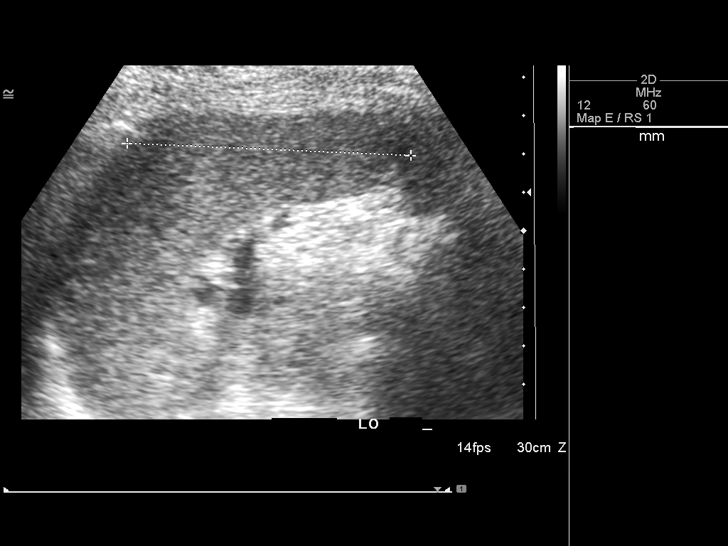
[im 62/93]
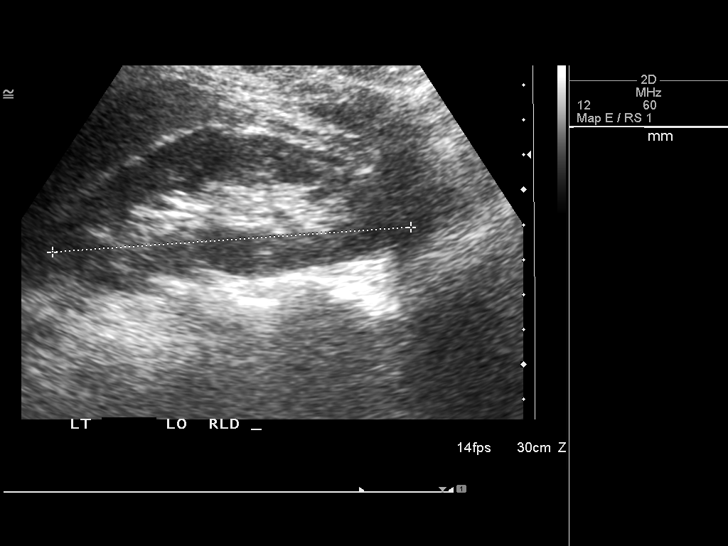
[im 70/93]
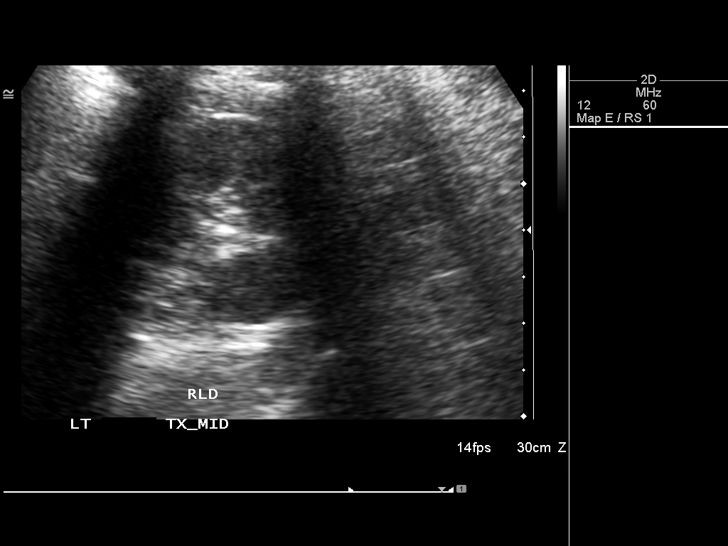
[im 77/93]
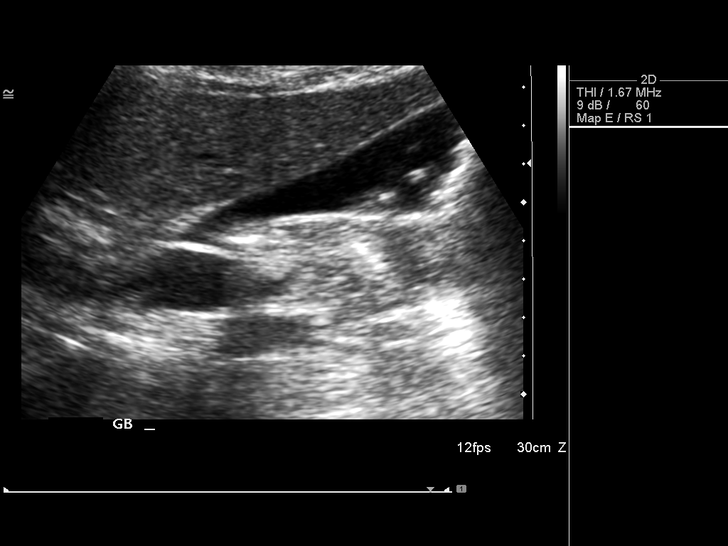
[im 85/93]
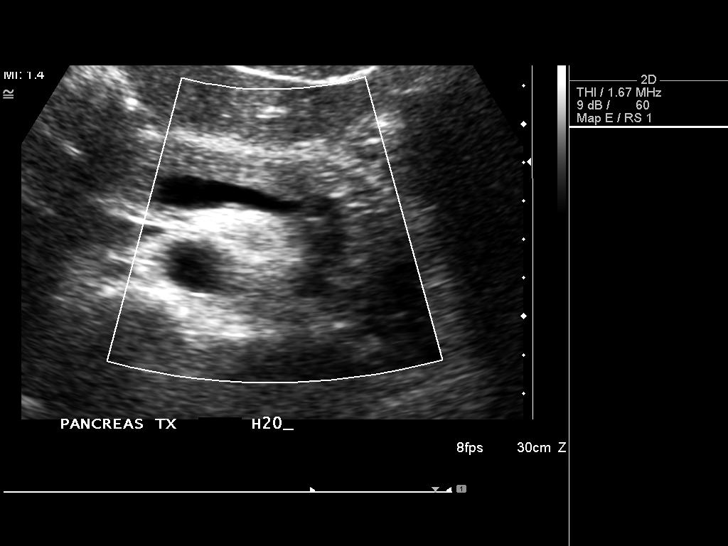
[im 93/93]
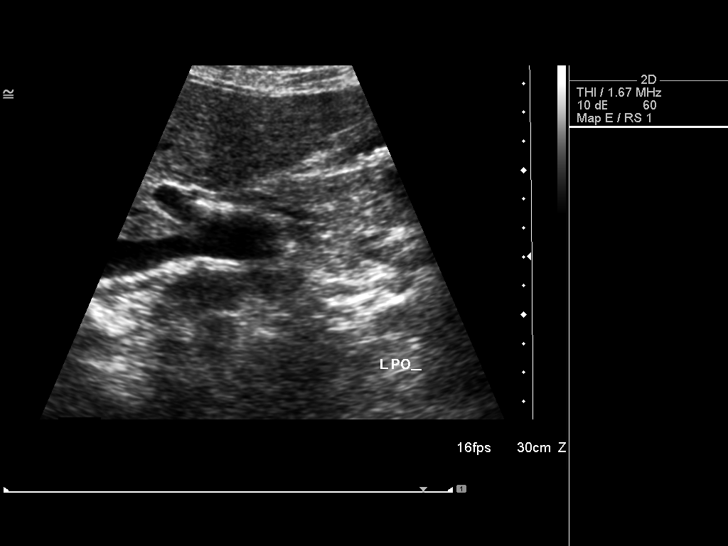

[14 of 25 positions shown; findings below may reference images not displayed]

FINDINGS: Gallbladder:  There are multiple gallstones within the gallbladder.
However there is no tenderness upon compression of the gallbladder
and the gallbladder wall is not thickened.

Common bile duct:  The common bile duct is slightly prominent
measuring 8.3 mm in diameter.  Correlation with liver function
tests is recommended.

Liver:  The liver has a normal echogenic pattern.  No focal
abnormality is seen.

IVC:  Appears normal.

Pancreas:  No focal abnormality seen.

Spleen:  The spleen is normal in size measuring 7.4 cm sagittally.

Right Kidney:  No hydronephrosis is noted.  The right kidney
measures 10.8 cm sagittally.

Left Kidney:  No hydronephrosis.  The left kidney measures 10.4 cm.

Abdominal aorta:  The abdominal aorta is normal in caliber.
IMPRESSION: 1.  Multiple gallstones.  No gallbladder wall thickening or pain
over the gallbladder.
2.  Common bile duct is slightly prominent.  Correlate with liver
function test.

## 2013-02-21 DIAGNOSIS — R519 Headache, unspecified: Secondary | ICD-10-CM | POA: Insufficient documentation

## 2013-02-21 DIAGNOSIS — R140 Abdominal distension (gaseous): Secondary | ICD-10-CM | POA: Insufficient documentation

## 2014-03-01 NOTE — L&D Delivery Note (Signed)
Delivery Note Progressed quickly to complete dilation after epidural.    At 6:05 AM a viable and healthy female was delivered via  (Presentation: OA ).  APGAR: 9/9 ; weight 5+15 .   Placenta status: spontaneous and grossly intact , noted to have foot cord  Anesthesia: Epidural with Local adjunct Episiotomy:  None Lacerations:  Shallow 1st degree midline perineal Suture Repair: none Est. Blood Loss (mL):  150  Mom to postpartum.  Baby to Couplet care / Skin to Skin.  [facility] 12/08/2014, 6:19 AM

## 2014-05-27 ENCOUNTER — Ambulatory Visit (INDEPENDENT_AMBULATORY_CARE_PROVIDER_SITE_OTHER): Payer: 59 | Admitting: Obstetrics & Gynecology

## 2014-05-27 ENCOUNTER — Encounter: Payer: Self-pay | Admitting: Obstetrics & Gynecology

## 2014-05-27 VITALS — BP 108/73 | HR 86 | Wt 153.0 lb

## 2014-05-27 DIAGNOSIS — Z124 Encounter for screening for malignant neoplasm of cervix: Secondary | ICD-10-CM | POA: Diagnosis not present

## 2014-05-27 DIAGNOSIS — Z3491 Encounter for supervision of normal pregnancy, unspecified, first trimester: Secondary | ICD-10-CM

## 2014-05-27 DIAGNOSIS — R110 Nausea: Secondary | ICD-10-CM

## 2014-05-27 DIAGNOSIS — Z1151 Encounter for screening for human papillomavirus (HPV): Secondary | ICD-10-CM | POA: Diagnosis not present

## 2014-05-27 DIAGNOSIS — Z3481 Encounter for supervision of other normal pregnancy, first trimester: Secondary | ICD-10-CM

## 2014-05-27 DIAGNOSIS — Z3480 Encounter for supervision of other normal pregnancy, unspecified trimester: Secondary | ICD-10-CM

## 2014-05-27 MED ORDER — PROMETHAZINE HCL 25 MG PO TABS: 25.0000 mg | ORAL_TABLET | Freq: Four times a day (QID) | ORAL | Status: DC | PRN

## 2014-05-27 NOTE — Progress Notes (Signed)
   Subjective:    Kim [name] is a [id number] [redacted]w[redacted]d being seen today for her first obstetrical visit.  Her obstetrical history is significant for nausea and vomitting. Patient does intend to breast feed. Pregnancy history fully reviewed.  Patient reports nausea and vomiting. She also tells me that she has had some SOB over the last few days. This has occurred on several occasions in her life. She has been evaluated and told it was from anxiety.  Filed Vitals:   05/27/14 1358  BP: 108/73  Pulse: 86  Weight: 153 lb (69.4 kg)    HISTORY: OB History  Gravida Para Term Preterm AB SAB TAB Ectopic Multiple Living  3 2 2       2     # Outcome Date GA Lbr Len/2nd Weight Sex Delivery Anes PTL Lv  3 Current           2 Term 11/07/08 [redacted]w[redacted]d   M Vag-Spont EPI  Y  1 Term 08/06/05 [redacted]w[redacted]d  6 lb 8 oz (2.948 kg) F Vag-Spont EPI  Y     Complications: Placenta Previa     History reviewed. No pertinent past medical history. Past Surgical History  Procedure Laterality Date  . Cholecystectomy     Family History  Problem Relation Age of Onset  . Diabetes Mother      Exam    Uterus:     Pelvic Exam:    Perineum: No Hemorrhoids   Vulva: normal   Vagina:  normal mucosa   pH:    Cervix: anteverted   Adnexa: normal adnexa   Bony Pelvis: android  System: Breast:  normal appearance, no masses or tenderness   Skin: normal coloration and turgor, no rashes    Neurologic: oriented   Extremities: normal strength, tone, and muscle mass   HEENT PERRLA   Mouth/Teeth mucous membranes moist, pharynx normal without lesions   Neck supple   Cardiovascular: regular rate and rhythm   Respiratory:  appears well, vitals normal, no respiratory distress, acyanotic, normal RR, ear and throat exam is normal, neck free of mass or lymphadenopathy, chest clear, no wheezing, crepitations, rhonchi, normal symmetric air entry   Abdomen: soft, non-tender; bowel sounds normal; no masses,  no organomegaly   Urinary:  urethral meatus normal      Assessment:    Pregnancy: [id number] Patient Active Problem List   Diagnosis Date Noted  . Supervision of other normal pregnancy, antepartum 05/27/2014  . ACUTE SINUSITIS, UNSPECIFIED 03/30/2010  . OTITIS EXTERNA, RIGHT 01/18/2010  . Impacted cerumen 12/15/2009  . ALLERGIC RHINITIS CAUSE UNSPECIFIED 12/15/2009  . Unspecified otitis media 10/22/2009  . GALLSTONES 12/31/2008  . EPIGASTRIC PAIN 12/30/2008        Plan:     Initial labs drawn. Prenatal vitamins. Problem list reviewed and updated. Genetic Screening discussed First Screen: declined.  Ultrasound discussed; fetal survey: requested.  Follow up in 4 weeks. I offered her a cards referral but she has decided to wait and see if she improves. I will call in phenergan for her nausea.   Jerelyn Trimarco C. 05/27/2014

## 2014-05-27 NOTE — Progress Notes (Signed)
Pt here for initial OB. Last pap over 1 year. Pt c/o shortness of breath. Bedside US shows single IUP with FHR 162 and GA [redacted]w[redacted]d 4.4cm

## 2014-05-28 LAB — PRENATAL PROFILE (SOLSTAS)
ANTIBODY SCREEN: NEGATIVE
BASOS ABS: 0 10*3/uL (ref 0.0–0.1)
Basophils Relative: 0 % (ref 0–1)
EOS ABS: 0.3 10*3/uL (ref 0.0–0.7)
EOS PCT: 3 % (ref 0–5)
HEMATOCRIT: 36.1 % (ref 36.0–46.0)
HEMOGLOBIN: 11.7 g/dL — AB (ref 12.0–15.0)
HEP B S AG: NEGATIVE
HIV 1&2 Ab, 4th Generation: NONREACTIVE
LYMPHS ABS: 2.2 10*3/uL (ref 0.7–4.0)
LYMPHS PCT: 26 % (ref 12–46)
MCH: 28.5 pg (ref 26.0–34.0)
MCHC: 32.4 g/dL (ref 30.0–36.0)
MCV: 87.8 fL (ref 78.0–100.0)
MPV: 11.3 fL (ref 8.6–12.4)
Monocytes Absolute: 0.5 10*3/uL (ref 0.1–1.0)
Monocytes Relative: 6 % (ref 3–12)
NEUTROS ABS: 5.6 10*3/uL (ref 1.7–7.7)
NEUTROS PCT: 65 % (ref 43–77)
Platelets: 213 10*3/uL (ref 150–400)
RBC: 4.11 MIL/uL (ref 3.87–5.11)
RDW: 13.3 % (ref 11.5–15.5)
RUBELLA: 3.89 {index} — AB (ref <0.90)
Rh Type: POSITIVE
WBC: 8.6 10*3/uL (ref 4.0–10.5)

## 2014-05-28 LAB — SICKLE CELL SCREEN: SICKLE CELL SCREEN: NEGATIVE

## 2014-05-28 LAB — GC/CHLAMYDIA PROBE AMP
CT PROBE, AMP APTIMA: NEGATIVE
GC PROBE AMP APTIMA: NEGATIVE

## 2014-05-29 LAB — CYTOLOGY - PAP

## 2014-05-29 LAB — CULTURE, OB URINE

## 2014-06-24 ENCOUNTER — Encounter: Payer: 59 | Admitting: Advanced Practice Midwife

## 2014-06-25 ENCOUNTER — Ambulatory Visit (INDEPENDENT_AMBULATORY_CARE_PROVIDER_SITE_OTHER): Payer: 59 | Admitting: Advanced Practice Midwife

## 2014-06-25 VITALS — BP 105/65 | HR 87 | Wt 154.0 lb

## 2014-06-25 DIAGNOSIS — Z3482 Encounter for supervision of other normal pregnancy, second trimester: Secondary | ICD-10-CM

## 2014-06-25 MED ORDER — COMPLETE NATAL DHA 29-1-200 & 250 MG PO MISC: 1.0000 | Freq: Every day | ORAL | Status: DC

## 2014-06-25 NOTE — Progress Notes (Signed)
Reviewed NOB labs. Anatomy scan ordered. Discussed

## 2014-06-25 NOTE — Patient Instructions (Addendum)
DHA supplement  Second Trimester of Pregnancy The second trimester is from week 13 through week 28, months 4 through 6. The second trimester is often a time when you feel your best. Your body has also adjusted to being pregnant, and you begin to feel better physically. Usually, morning sickness has lessened or quit completely, you may have more energy, and you may have an increase in appetite. The second trimester is also a time when the fetus is growing rapidly. At the end of the sixth month, the fetus is about 9 inches long and weighs about 1 pounds. You will likely begin to feel the baby move (quickening) between 18 and 20 weeks of the pregnancy. BODY CHANGES Your body goes through many changes during pregnancy. The changes vary from woman to woman.   Your weight will continue to increase. You will notice your lower abdomen bulging out.  You may begin to get stretch marks on your hips, abdomen, and breasts.  You may develop headaches that can be relieved by medicines approved by your health care provider.  You may urinate more often because the fetus is pressing on your bladder.  You may develop or continue to have heartburn as a result of your pregnancy.  You may develop constipation because certain hormones are causing the muscles that push waste through your intestines to slow down.  You may develop hemorrhoids or swollen, bulging veins (varicose veins).  You may have back pain because of the weight gain and pregnancy hormones relaxing your joints between the bones in your pelvis and as a result of a shift in weight and the muscles that support your balance.  Your breasts will continue to grow and be tender.  Your gums may bleed and may be sensitive to brushing and flossing.  Dark spots or blotches (chloasma, mask of pregnancy) may develop on your face. This will likely fade after the baby is born.  A dark line from your belly button to the pubic area (linea nigra) may appear.  This will likely fade after the baby is born.  You may have changes in your hair. These can include thickening of your hair, rapid growth, and changes in texture. Some women also have hair loss during or after pregnancy, or hair that feels dry or thin. Your hair will most likely return to normal after your baby is born. WHAT TO EXPECT AT YOUR PRENATAL VISITS During a routine prenatal visit:  You will be weighed to make sure you and the fetus are growing normally.  Your blood pressure will be taken.  Your abdomen will be measured to track your baby's growth.  The fetal heartbeat will be listened to.  Any test results from the previous visit will be discussed. Your health care provider may ask you:  How you are feeling.  If you are feeling the baby move.  If you have had any abnormal symptoms, such as leaking fluid, bleeding, severe headaches, or abdominal cramping.  If you have any questions. Other tests that may be performed during your second trimester include:  Blood tests that check for:  Low iron levels (anemia).  Gestational diabetes (between 24 and 28 weeks).  Rh antibodies.  Urine tests to check for infections, diabetes, or protein in the urine.  An ultrasound to confirm the proper growth and development of the baby.  An amniocentesis to check for possible genetic problems.  Fetal screens for spina bifida and Down syndrome. HOME CARE INSTRUCTIONS   Avoid all smoking, herbs,  alcohol, and unprescribed drugs. These chemicals affect the formation and growth of the baby.  Follow your health care provider's instructions regarding medicine use. There are medicines that are either safe or unsafe to take during pregnancy.  Exercise only as directed by your health care provider. Experiencing uterine cramps is a good sign to stop exercising.  Continue to eat regular, healthy meals.  Wear a good support bra for breast tenderness.  Do not use hot tubs, steam rooms, or  saunas.  Wear your seat belt at all times when driving.  Avoid raw meat, uncooked cheese, cat litter boxes, and soil used by cats. These carry germs that can cause birth defects in the baby.  Take your prenatal vitamins.  Try taking a stool softener (if your health care provider approves) if you develop constipation. Eat more high-fiber foods, such as fresh vegetables or fruit and whole grains. Drink plenty of fluids to keep your urine clear or pale yellow.  Take warm sitz baths to soothe any pain or discomfort caused by hemorrhoids. Use hemorrhoid cream if your health care provider approves.  If you develop varicose veins, wear support hose. Elevate your feet for 15 minutes, 3-4 times a day. Limit salt in your diet.  Avoid heavy lifting, wear low heel shoes, and practice good posture.  Rest with your legs elevated if you have leg cramps or low back pain.  Visit your dentist if you have not gone yet during your pregnancy. Use a soft toothbrush to brush your teeth and be gentle when you floss.  A sexual relationship may be continued unless your health care provider directs you otherwise.  Continue to go to all your prenatal visits as directed by your health care provider. SEEK MEDICAL CARE IF:   You have dizziness.  You have mild pelvic cramps, pelvic pressure, or nagging pain in the abdominal area.  You have persistent nausea, vomiting, or diarrhea.  You have a bad smelling vaginal discharge.  You have pain with urination. SEEK IMMEDIATE MEDICAL CARE IF:   You have a fever.  You are leaking fluid from your vagina.  You have spotting or bleeding from your vagina.  You have severe abdominal cramping or pain.  You have rapid weight gain or loss.  You have shortness of breath with chest pain.  You notice sudden or extreme swelling of your face, hands, ankles, feet, or legs.  You have not felt your baby move in over an hour.  You have severe headaches that do not go  away with medicine.  You have vision changes. Document Released: 02/09/2001 Document Revised: 02/20/2013 Document Reviewed: 04/18/2012 [facility] Patient Information 2015 [place], [place]. This information is not intended to replace advice given to you by your health care provider. Make sure you discuss any questions you have with your health care provider.   Round Ligament Pain During Pregnancy   Round ligament pain is a sharp pain or jabbing feeling often felt in the lower belly or groin area on one or both sides. It is one of the most common complaints during pregnancy and is considered a normal part of pregnancy. It is most often felt during the second trimester.   Here is what you need to know about round ligament pain, including some tips to help you feel better.   Causes of Round Ligament Pain   Several thick ligaments surround and support your womb (uterus) as it grows during pregnancy. One of them is called the round ligament.   The  round ligament connects the front part of the womb to your groin, the area where your legs attach to your pelvis. The round ligament normally tightens and relaxes slowly.   As your baby and womb grow, the round ligament stretches. That makes it more likely to become strained.   Sudden movements can cause the ligament to tighten quickly, like a rubber band snapping. This causes a sudden and quick jabbing feeling.   Symptoms of Round Ligament Pain   Round ligament pain can be concerning and uncomfortable. But it is considered normal as your body changes during pregnancy.   The symptoms of round ligament pain include a sharp, sudden spasm in the belly. It usually affects the right side, but it may happen on both sides. The pain only lasts a few seconds.   Exercise may cause the pain, as will rapid movements such as:  sneezing  coughing  laughing  rolling over in bed  standing up too quickly   Treatment of Round Ligament Pain   Here are some tips  that may help reduce your discomfort:   Pain relief. Take over-the-counter acetaminophen for pain, if necessary. Ask your doctor if this is OK.   Exercise. Get plenty of exercise to keep your stomach (core) muscles strong. Doing stretching exercises or prenatal yoga can be helpful. Ask your doctor which exercises are safe for you and your baby.   A helpful exercise involves putting your hands and knees on the floor, lowering your head, and pushing your backside into the air.   Avoid sudden movements. Change positions slowly (such as standing up or sitting down) to avoid sudden movements that may cause stretching and pain.   Flex your hips. Bend and flex your hips before you cough, sneeze, or laugh to avoid pulling on the ligaments.   Apply warmth. A heating pad or warm bath may be helpful. Ask your doctor if this is OK. Extreme heat can be dangerous to the baby.   You should try to modify your daily activity level and avoid positions that may worsen the condition.   When to Call the Doctor/Midwife   Always tell your doctor or midwife about any type of pain you have during pregnancy. Round ligament pain is quick and doesn't last long.   Call your health care provider immediately if you have:  severe pain  fever  chills  pain on urination  difficulty walking   Belly pain during pregnancy can be due to many different causes. It is important for your doctor to rule out more serious conditions, including pregnancy complications such as placenta abruption or non-pregnancy illnesses such as:  inguinal hernia  appendicitis  stomach, liver, and kidney problems  Preterm labor pains may sometimes be mistaken for round ligament pain.

## 2014-07-22 ENCOUNTER — Ambulatory Visit (HOSPITAL_COMMUNITY)
Admission: RE | Admit: 2014-07-22 | Discharge: 2014-07-22 | Disposition: A | Discharge status: Home / Self Care | Payer: 59 | Source: Ambulatory Visit | Attending: Advanced Practice Midwife | Admitting: Advanced Practice Midwife

## 2014-07-22 DIAGNOSIS — Z3482 Encounter for supervision of other normal pregnancy, second trimester: Secondary | ICD-10-CM | POA: Diagnosis not present

## 2014-07-22 DIAGNOSIS — Z3689 Encounter for other specified antenatal screening: Secondary | ICD-10-CM | POA: Insufficient documentation

## 2014-07-22 DIAGNOSIS — Z3A19 19 weeks gestation of pregnancy: Secondary | ICD-10-CM | POA: Insufficient documentation

## 2014-07-24 ENCOUNTER — Ambulatory Visit (INDEPENDENT_AMBULATORY_CARE_PROVIDER_SITE_OTHER): Payer: 59 | Admitting: Obstetrics & Gynecology

## 2014-07-24 VITALS — BP 110/67 | HR 90 | Wt 163.0 lb

## 2014-07-24 DIAGNOSIS — Z3482 Encounter for supervision of other normal pregnancy, second trimester: Secondary | ICD-10-CM

## 2014-07-24 DIAGNOSIS — R519 Headache, unspecified: Secondary | ICD-10-CM

## 2014-07-24 DIAGNOSIS — R51 Headache: Secondary | ICD-10-CM

## 2014-07-24 MED ORDER — COMPLETE NATAL DHA 29-1-200 & 250 MG PO MISC: 1.0000 | Freq: Every day | ORAL | Status: DC

## 2014-07-24 NOTE — Progress Notes (Signed)
Nml anatomy.  No problems.  Routine care.  RTC 4 weeks.

## 2014-08-05 ENCOUNTER — Telehealth: Payer: Self-pay

## 2014-08-05 MED ORDER — CONCEPT DHA 53.5-38-1 MG PO CAPS: 1.0000 | ORAL_CAPSULE | Freq: Every day | ORAL | Status: DC

## 2014-08-05 NOTE — Telephone Encounter (Signed)
Patient requesting concept DHA to be called in. Patient has had success using this in the past. Kathrene Alu RN BSN 08-05-14.

## 2014-08-19 ENCOUNTER — Ambulatory Visit (INDEPENDENT_AMBULATORY_CARE_PROVIDER_SITE_OTHER): Payer: 59 | Admitting: Advanced Practice Midwife

## 2014-08-19 VITALS — BP 97/60 | HR 82 | Wt 170.0 lb

## 2014-08-19 DIAGNOSIS — D259 Leiomyoma of uterus, unspecified: Secondary | ICD-10-CM

## 2014-08-19 DIAGNOSIS — Z3482 Encounter for supervision of other normal pregnancy, second trimester: Secondary | ICD-10-CM

## 2014-08-19 NOTE — Patient Instructions (Addendum)
Preterm Labor Information Preterm labor is when labor starts at less than 37 weeks of pregnancy. The normal length of a pregnancy is 39 to 41 weeks. CAUSES Often, there is no identifiable underlying cause as to why a woman goes into preterm labor. One of the most common known causes of preterm labor is infection. Infections of the uterus, cervix, vagina, amniotic sac, bladder, kidney, or even the lungs (pneumonia) can cause labor to start. Other suspected causes of preterm labor include:   Urogenital infections, such as yeast infections and bacterial vaginosis.   Uterine abnormalities (uterine shape, uterine septum, fibroids, or bleeding from the placenta).   A cervix that has been operated on (it may fail to stay closed).   Malformations in the fetus.   Multiple gestations (twins, triplets, and so on).   Breakage of the amniotic sac.  RISK FACTORS  Having a previous history of preterm labor.   Having premature rupture of membranes (PROM).   Having a placenta that covers the opening of the cervix (placenta previa).   Having a placenta that separates from the uterus (placental abruption).   Having a cervix that is too weak to hold the fetus in the uterus (incompetent cervix).   Having too much fluid in the amniotic sac (polyhydramnios).   Taking illegal drugs or smoking while pregnant.   Not gaining enough weight while pregnant.   Being younger than 55 and older than 33 years old.   Having a low socioeconomic status.   Being African American. SYMPTOMS Signs and symptoms of preterm labor include:   Menstrual-like cramps, abdominal pain, or back pain.  Uterine contractions that are regular, as frequent as six in an hour, regardless of their intensity (may be mild or painful).  Contractions that start on the top of the uterus and spread down to the lower abdomen and back.   A sense of increased pelvic pressure.   A watery or bloody mucus discharge that  comes from the vagina.  TREATMENT Depending on the length of the pregnancy and other circumstances, your health care provider may suggest bed rest. If necessary, there are medicines that can be given to stop contractions and to mature the fetal lungs. If labor happens before 34 weeks of pregnancy, a prolonged hospital stay may be recommended. Treatment depends on the condition of both you and the fetus.  WHAT SHOULD YOU DO IF YOU THINK YOU ARE IN PRETERM LABOR? Call your health care provider right away. You will need to go to the hospital to get checked immediately. HOW CAN YOU PREVENT PRETERM LABOR IN FUTURE PREGNANCIES? You should:   Stop smoking if you smoke.  Maintain healthy weight gain and avoid chemicals and drugs that are not necessary.  Be watchful for any type of infection.  Inform your health care provider if you have a known history of preterm labor. Document Released: 05/08/2003 Document Revised: 10/18/2012 Document Reviewed: 03/20/2012 [facility] Patient Information 2015 [place], [place]. This information is not intended to replace advice given to you by your health care provider. Make sure you discuss any questions you have with your health care provider.  Round Ligament Pain During Pregnancy   Round ligament pain is a sharp pain or jabbing feeling often felt in the lower belly or groin area on one or both sides. It is one of the most common complaints during pregnancy and is considered a normal part of pregnancy. It is most often felt during the second trimester.   Here is what you  need to know about round ligament pain, including some tips to help you feel better.   Causes of Round Ligament Pain   Several thick ligaments surround and support your womb (uterus) as it grows during pregnancy. One of them is called the round ligament.   The round ligament connects the front part of the womb to your groin, the area where your legs attach to your pelvis. The round ligament  normally tightens and relaxes slowly.   As your baby and womb grow, the round ligament stretches. That makes it more likely to become strained.   Sudden movements can cause the ligament to tighten quickly, like a rubber band snapping. This causes a sudden and quick jabbing feeling.   Symptoms of Round Ligament Pain   Round ligament pain can be concerning and uncomfortable. But it is considered normal as your body changes during pregnancy.   The symptoms of round ligament pain include a sharp, sudden spasm in the belly. It usually affects the right side, but it may happen on both sides. The pain only lasts a few seconds.   Exercise may cause the pain, as will rapid movements such as:  sneezing  coughing  laughing  rolling over in bed  standing up too quickly   Treatment of Round Ligament Pain   Here are some tips that may help reduce your discomfort:   Pain relief. Take over-the-counter acetaminophen for pain, if necessary. Ask your doctor if this is OK.   Exercise. Get plenty of exercise to keep your stomach (core) muscles strong. Doing stretching exercises or prenatal yoga can be helpful. Ask your doctor which exercises are safe for you and your baby.   A helpful exercise involves putting your hands and knees on the floor, lowering your head, and pushing your backside into the air.   Avoid sudden movements. Change positions slowly (such as standing up or sitting down) to avoid sudden movements that may cause stretching and pain.   Flex your hips. Bend and flex your hips before you cough, sneeze, or laugh to avoid pulling on the ligaments.   Apply warmth. A heating pad or warm bath may be helpful. Ask your doctor if this is OK. Extreme heat can be dangerous to the baby.   You should try to modify your daily activity level and avoid positions that may worsen the condition.   When to Call the Doctor/Midwife   Always tell your doctor or midwife about any type of pain you have  during pregnancy. Round ligament pain is quick and doesn't last long.   Call your health care provider immediately if you have:  severe pain  fever  chills  pain on urination  difficulty walking   Belly pain during pregnancy can be due to many different causes. It is important for your doctor to rule out more serious conditions, including pregnancy complications such as placenta abruption or non-pregnancy illnesses such as:  inguinal hernia  appendicitis  stomach, liver, and kidney problems  Preterm labor pains may sometimes be mistaken for round ligament pain.

## 2014-08-19 NOTE — Progress Notes (Signed)
Reviewed anatomy scan. 28 week labs at [facility].

## 2014-09-16 ENCOUNTER — Encounter: Payer: Self-pay | Admitting: Advanced Practice Midwife

## 2014-09-16 ENCOUNTER — Ambulatory Visit (INDEPENDENT_AMBULATORY_CARE_PROVIDER_SITE_OTHER): Payer: 59 | Admitting: Advanced Practice Midwife

## 2014-09-16 VITALS — BP 107/64 | HR 92 | Wt 176.0 lb

## 2014-09-16 DIAGNOSIS — Z3482 Encounter for supervision of other normal pregnancy, second trimester: Secondary | ICD-10-CM

## 2014-09-16 DIAGNOSIS — Z23 Encounter for immunization: Secondary | ICD-10-CM | POA: Diagnosis not present

## 2014-09-16 DIAGNOSIS — Z36 Encounter for antenatal screening of mother: Secondary | ICD-10-CM | POA: Diagnosis not present

## 2014-09-16 DIAGNOSIS — Z3490 Encounter for supervision of normal pregnancy, unspecified, unspecified trimester: Secondary | ICD-10-CM

## 2014-09-16 NOTE — Progress Notes (Signed)
Subjective:  Sherisse Fullilove is a 34 y.o. G3P2002 at [redacted]w[redacted]d being seen today for ongoing prenatal care.  Patient reports no complaints.  Contractions: Not present.  Vag. Bleeding: None. Movement: Present. Denies leaking of fluid.   The following portions of the patient's history were reviewed and updated as appropriate: allergies, current medications, past family history, past medical history, past social history, past surgical history and problem list.   Objective:   Filed Vitals:   09/16/14 1017  BP: 107/64  Pulse: 92  Weight: 176 lb (79.833 kg)    Fetal Status:     Movement: Present     General:  Alert, oriented and cooperative. Patient is in no acute distress.  Skin: Skin is warm and dry. No rash noted.   Cardiovascular: Normal heart rate noted  Respiratory: Normal respiratory effort, no problems with respiration noted  Abdomen: Soft, gravid, appropriate for gestational age. Pain/Pressure: Absent     Vaginal: Vag. Bleeding: None.    Vag D/C Character: Thin  Cervix: Not evaluated        Extremities: Normal range of motion.  Edema: None  Mental Status: Normal mood and affect. Normal behavior. Normal judgment and thought content.   Urinalysis:      Assessment and Plan:  Pregnancy: G3P2002 at [redacted]w[redacted]d  1. Prenatal care, antepartum     Continue normal activity     Asked about having another [place]. Discussed no medical reason now, but if one presents we will order one  - Glucose Tolerance, 1 HR (50g) - HIV antibody (with reflex) - RPR - CBC  Preterm labor symptoms and general obstetric precautions including but not limited to vaginal bleeding, contractions, leaking of fluid and fetal movement were reviewed in detail with the patient. Please refer to After Visit Summary for other counseling recommendations.  No Follow-up on file. Return in 2 weeks  Seabron Spates, CNM

## 2014-09-16 NOTE — Patient Instructions (Signed)
Third Trimester of Pregnancy The third trimester is from week 29 through week 42, months 7 through 9. The third trimester is a time when the fetus is growing rapidly. At the end of the ninth month, the fetus is about 20 inches in length and weighs 6-10 pounds.  BODY CHANGES Your body goes through many changes during pregnancy. The changes vary from woman to woman.   Your weight will continue to increase. You can expect to gain 25-35 pounds (11-16 kg) by the end of the pregnancy.  You may begin to get stretch marks on your hips, abdomen, and breasts.  You may urinate more often because the fetus is moving lower into your pelvis and pressing on your bladder.  You may develop or continue to have heartburn as a result of your pregnancy.  You may develop constipation because certain hormones are causing the muscles that push waste through your intestines to slow down.  You may develop hemorrhoids or swollen, bulging veins (varicose veins).  You may have pelvic pain because of the weight gain and pregnancy hormones relaxing your joints between the bones in your pelvis. Backaches may result from overexertion of the muscles supporting your posture.  You may have changes in your hair. These can include thickening of your hair, rapid growth, and changes in texture. Some women also have hair loss during or after pregnancy, or hair that feels dry or thin. Your hair will most likely return to normal after your baby is born.  Your breasts will continue to grow and be tender. A yellow discharge may leak from your breasts called colostrum.  Your belly button may stick out.  You may feel short of breath because of your expanding uterus.  You may notice the fetus "dropping," or moving lower in your abdomen.  You may have a bloody mucus discharge. This usually occurs a few days to a week before labor begins.  Your cervix becomes thin and soft (effaced) near your due date. WHAT TO EXPECT AT YOUR PRENATAL  EXAMS  You will have prenatal exams every 2 weeks until week 36. Then, you will have weekly prenatal exams. During a routine prenatal visit:  You will be weighed to make sure you and the fetus are growing normally.  Your blood pressure is taken.  Your abdomen will be measured to track your baby's growth.  The fetal heartbeat will be listened to.  Any test results from the previous visit will be discussed.  You may have a cervical check near your due date to see if you have effaced. At around 36 weeks, your caregiver will check your cervix. At the same time, your caregiver will also perform a test on the secretions of the vaginal tissue. This test is to determine if a type of bacteria, Group B streptococcus, is present. Your caregiver will explain this further. Your caregiver may ask you:  What your birth plan is.  How you are feeling.  If you are feeling the baby move.  If you have had any abnormal symptoms, such as leaking fluid, bleeding, severe headaches, or abdominal cramping.  If you have any questions. Other tests or screenings that may be performed during your third trimester include:  Blood tests that check for low iron levels (anemia).  Fetal testing to check the health, activity level, and growth of the fetus. Testing is done if you have certain medical conditions or if there are problems during the pregnancy. FALSE LABOR You may feel small, irregular contractions that   eventually go away. These are called Braxton Hicks contractions, or false labor. Contractions may last for hours, days, or even weeks before true labor sets in. If contractions come at regular intervals, intensify, or become painful, it is best to be seen by your caregiver.  SIGNS OF LABOR   Menstrual-like cramps.  Contractions that are 5 minutes apart or less.  Contractions that start on the top of the uterus and spread down to the lower abdomen and back.  A sense of increased pelvic pressure or back  pain.  A watery or bloody mucus discharge that comes from the vagina. If you have any of these signs before the 37th week of pregnancy, call your caregiver right away. You need to go to the hospital to get checked immediately. HOME CARE INSTRUCTIONS   Avoid all smoking, herbs, alcohol, and unprescribed drugs. These chemicals affect the formation and growth of the baby.  Follow your caregiver's instructions regarding medicine use. There are medicines that are either safe or unsafe to take during pregnancy.  Exercise only as directed by your caregiver. Experiencing uterine cramps is a good sign to stop exercising.  Continue to eat regular, healthy meals.  Wear a good support bra for breast tenderness.  Do not use hot tubs, steam rooms, or saunas.  Wear your seat belt at all times when driving.  Avoid raw meat, uncooked cheese, cat litter boxes, and soil used by cats. These carry germs that can cause birth defects in the baby.  Take your prenatal vitamins.  Try taking a stool softener (if your caregiver approves) if you develop constipation. Eat more high-fiber foods, such as fresh vegetables or fruit and whole grains. Drink plenty of fluids to keep your urine clear or pale yellow.  Take warm sitz baths to soothe any pain or discomfort caused by hemorrhoids. Use hemorrhoid cream if your caregiver approves.  If you develop varicose veins, wear support hose. Elevate your feet for 15 minutes, 3-4 times a day. Limit salt in your diet.  Avoid heavy lifting, wear low heal shoes, and practice good posture.  Rest a lot with your legs elevated if you have leg cramps or low back pain.  Visit your dentist if you have not gone during your pregnancy. Use a soft toothbrush to brush your teeth and be gentle when you floss.  A sexual relationship may be continued unless your caregiver directs you otherwise.  Do not travel far distances unless it is absolutely necessary and only with the approval  of your caregiver.  Take prenatal classes to understand, practice, and ask questions about the labor and delivery.  Make a trial run to the hospital.  Pack your hospital bag.  Prepare the baby's nursery.  Continue to go to all your prenatal visits as directed by your caregiver. SEEK MEDICAL CARE IF:  You are unsure if you are in labor or if your water has broken.  You have dizziness.  You have mild pelvic cramps, pelvic pressure, or nagging pain in your abdominal area.  You have persistent nausea, vomiting, or diarrhea.  You have a bad smelling vaginal discharge.  You have pain with urination. SEEK IMMEDIATE MEDICAL CARE IF:   You have a fever.  You are leaking fluid from your vagina.  You have spotting or bleeding from your vagina.  You have severe abdominal cramping or pain.  You have rapid weight loss or gain.  You have shortness of breath with chest pain.  You notice sudden or extreme swelling   of your face, hands, ankles, feet, or legs.  You have not felt your baby move in over an hour.  You have severe headaches that do not go away with medicine.  You have vision changes. Document Released: 02/09/2001 Document Revised: 02/20/2013 Document Reviewed: 04/18/2012 [employer] Patient Information 2015 [employer]. This information is not intended to replace advice given to you by your health care provider. Make sure you discuss any questions you have with your health care provider.  

## 2014-09-17 ENCOUNTER — Telehealth: Payer: Self-pay | Admitting: *Deleted

## 2014-09-17 LAB — CBC
HCT: 32.9 % — ABNORMAL LOW (ref 36.0–46.0)
HEMOGLOBIN: 10.7 g/dL — AB (ref 12.0–15.0)
MCH: 28.5 pg (ref 26.0–34.0)
MCHC: 32.5 g/dL (ref 30.0–36.0)
MCV: 87.7 fL (ref 78.0–100.0)
MPV: 10.6 fL (ref 8.6–12.4)
PLATELETS: 189 10*3/uL (ref 150–400)
RBC: 3.75 MIL/uL — AB (ref 3.87–5.11)
RDW: 13.3 % (ref 11.5–15.5)
WBC: 9.6 10*3/uL (ref 4.0–10.5)

## 2014-09-17 LAB — GLUCOSE TOLERANCE, 1 HOUR (50G) W/O FASTING: Glucose, 1 Hour GTT: 134 mg/dL (ref 70–140)

## 2014-09-17 LAB — RPR

## 2014-09-17 LAB — HIV ANTIBODY (ROUTINE TESTING W REFLEX): HIV 1&2 Ab, 4th Generation: NONREACTIVE

## 2014-09-17 NOTE — Telephone Encounter (Signed)
Pt informed of normal labs and 1 hr GTT

## 2014-09-30 ENCOUNTER — Ambulatory Visit (INDEPENDENT_AMBULATORY_CARE_PROVIDER_SITE_OTHER): Payer: 59 | Admitting: Advanced Practice Midwife

## 2014-09-30 VITALS — BP 104/66 | HR 102 | Wt 180.0 lb

## 2014-09-30 DIAGNOSIS — O9989 Other specified diseases and conditions complicating pregnancy, childbirth and the puerperium: Secondary | ICD-10-CM

## 2014-09-30 DIAGNOSIS — R630 Anorexia: Secondary | ICD-10-CM

## 2014-09-30 DIAGNOSIS — Z3403 Encounter for supervision of normal first pregnancy, third trimester: Secondary | ICD-10-CM

## 2014-09-30 DIAGNOSIS — R42 Dizziness and giddiness: Secondary | ICD-10-CM

## 2014-09-30 DIAGNOSIS — R102 Pelvic and perineal pain: Secondary | ICD-10-CM

## 2014-09-30 DIAGNOSIS — O26899 Other specified pregnancy related conditions, unspecified trimester: Secondary | ICD-10-CM

## 2014-09-30 LAB — FETAL NONSTRESS TEST
Accelerations > 10bpm: 3
NST ASSESSMENT: REACTIVE
NST Baseline: 150

## 2014-09-30 NOTE — Progress Notes (Signed)
Subjective:  Kim [name] is a 34 y.o. G3P2002 at [redacted]w[redacted]d being seen today for ongoing prenatal care.  Patient reports dizziness and lack of appetite.  She also reports sharp inguinal pains and back pain increasing with movement.   Contractions: Irregular.  Vag. Bleeding: None. Movement: Present. Denies leaking of fluid.   The following portions of the patient's history were reviewed and updated as appropriate: allergies, current medications, past family history, past medical history, past social history, past surgical history and problem list.   Objective:   Filed Vitals:   09/30/14 0959  BP: 104/66  Pulse: 102  Weight: 180 lb (81.647 kg)    Fetal Status: Fetal Heart Rate (bpm): NST-R   Movement: Present     FHT by doppler 180s x 1 minute. NST with baseline 150s, and 15x15 accels, no decels.    General:  Alert, oriented and cooperative. Patient is in no acute distress.  Skin: Skin is warm and dry. No rash noted.   Cardiovascular: Normal heart rate noted  Respiratory: Normal respiratory effort, no problems with respiration noted  Abdomen: Soft, gravid, appropriate for gestational age. Pain/Pressure: Present     Vaginal: Vag. Bleeding: None.    Vag D/C Character: Thin  Cervix: Not evaluated        Extremities: Normal range of motion.  Edema: Trace  Mental Status: Normal mood and affect. Normal behavior. Normal judgment and thought content.   Urinalysis: Urine Protein: Negative Urine Glucose: Negative  Assessment and Plan:  Pregnancy: G3P2002 at [redacted]w[redacted]d 1. Pain of round ligament affecting pregnancy, antepartum   2. Lack of appetite   3. Dizziness     Preterm labor symptoms and general obstetric precautions including but not limited to vaginal bleeding, contractions, leaking of fluid and fetal movement were reviewed in detail with the patient.  Discussed dizziness, lack of appetite with pt.  Increase PO fluids, try to eat small frequent meals/snacks even if not hungry.  Try Ensure or  milkshakes for calories.  Call office if symptoms worsen/persist.   Please refer to After Visit Summary for other counseling recommendations.  Return in about 2 weeks (around 10/14/2014).   Elvera Maria, CNM

## 2014-09-30 NOTE — Patient Instructions (Signed)

## 2014-09-30 NOTE — Addendum Note (Signed)
Addended by: Gretchen Short on: 09/30/2014 01:41 PM   Modules accepted: Orders

## 2014-10-14 ENCOUNTER — Ambulatory Visit (INDEPENDENT_AMBULATORY_CARE_PROVIDER_SITE_OTHER): Payer: 59 | Admitting: Advanced Practice Midwife

## 2014-10-14 VITALS — BP 102/61 | HR 104 | Wt 178.0 lb

## 2014-10-14 DIAGNOSIS — R109 Unspecified abdominal pain: Secondary | ICD-10-CM | POA: Diagnosis not present

## 2014-10-14 DIAGNOSIS — O26899 Other specified pregnancy related conditions, unspecified trimester: Secondary | ICD-10-CM

## 2014-10-14 DIAGNOSIS — O9989 Other specified diseases and conditions complicating pregnancy, childbirth and the puerperium: Secondary | ICD-10-CM

## 2014-10-14 DIAGNOSIS — Z3483 Encounter for supervision of other normal pregnancy, third trimester: Secondary | ICD-10-CM

## 2014-10-14 NOTE — Progress Notes (Signed)
Subjective:  Kim [name] is a 34 y.o. G3P2002 at [redacted]w[redacted]d being seen today for ongoing prenatal care.  Patient reports cramping and urinary urgency.  Contractions: Irregular.  Vag. Bleeding: None. Movement: Present. Denies leaking of fluid.   The following portions of the patient's history were reviewed and updated as appropriate: allergies, current medications, past family history, past medical history, past social history, past surgical history and problem list.   Objective:   Filed Vitals:   10/14/14 1019  BP: 102/61  Pulse: 104  Weight: 178 lb (80.74 kg)    Fetal Status: Fetal Heart Rate (bpm): 158 Fundal Height: 31 cm Movement: Present     General:  Alert, oriented and cooperative. Patient is in no acute distress.  Skin: Skin is warm and dry. No rash noted.   Cardiovascular: Normal heart rate noted  Respiratory: Normal respiratory effort, no problems with respiration noted  Abdomen: Soft, gravid, appropriate for gestational age. Pain/Pressure: Present     Pelvic: Vag. Bleeding: None Vag D/C Character: Thin   Cervical exam performed Dilation: Closed Effacement (%): 0 Station: Ballotable  Extremities: Normal range of motion.  Edema: Trace  Mental Status: Normal mood and affect. Normal behavior. Normal judgment and thought content.   Urinalysis: Urine Protein: Trace Urine Glucose: Negative  Assessment and Plan:  Pregnancy: G3P2002 at [redacted]w[redacted]d  1. Abdominal pain affecting pregnancy -  Cervix closed - Culture, OB Urine  Preterm labor symptoms and general obstetric precautions including but not limited to vaginal bleeding, contractions, leaking of fluid and fetal movement were reviewed in detail with the patient. Please refer to After Visit Summary for other counseling recommendations.  Return in about 2 weeks (around 10/28/2014).   Elvera Maria, CNM

## 2014-10-16 LAB — CULTURE, OB URINE

## 2014-10-22 ENCOUNTER — Telehealth: Payer: Self-pay | Admitting: *Deleted

## 2014-10-22 DIAGNOSIS — K649 Unspecified hemorrhoids: Secondary | ICD-10-CM

## 2014-10-22 MED ORDER — HYDROCORTISONE 2.5 % RE CREA: 1.0000 "application " | TOPICAL_CREAM | Freq: Two times a day (BID) | RECTAL | Status: DC

## 2014-10-22 NOTE — Telephone Encounter (Signed)
Pt called crying due to hemorrhoids,  She has used Preparation H but still hurting.  Will try Anusol cream for 2 days if no improvement will come into office.  Sitz baths recommended.

## 2014-10-23 ENCOUNTER — Ambulatory Visit (INDEPENDENT_AMBULATORY_CARE_PROVIDER_SITE_OTHER): Payer: 59 | Admitting: Obstetrics & Gynecology

## 2014-10-23 VITALS — Wt 179.0 lb

## 2014-10-23 DIAGNOSIS — O26843 Uterine size-date discrepancy, third trimester: Secondary | ICD-10-CM

## 2014-10-23 DIAGNOSIS — O365990 Maternal care for other known or suspected poor fetal growth, unspecified trimester, not applicable or unspecified: Secondary | ICD-10-CM

## 2014-10-23 DIAGNOSIS — IMO0002 Reserved for concepts with insufficient information to code with codable children: Secondary | ICD-10-CM

## 2014-10-23 DIAGNOSIS — Z3483 Encounter for supervision of other normal pregnancy, third trimester: Secondary | ICD-10-CM

## 2014-10-23 DIAGNOSIS — O2240 Hemorrhoids in pregnancy, unspecified trimester: Secondary | ICD-10-CM

## 2014-10-23 MED ORDER — HYDROCORTISONE ACE-PRAMOXINE 1-1 % RE FOAM: 1.0000 | Freq: Two times a day (BID) | RECTAL | Status: DC

## 2014-10-23 NOTE — Patient Instructions (Signed)
About Hemorrhoids  Hemorrhoids are swollen veins in the lower rectum and anus.  Also called piles, hemorrhoids are a common problem.  Hemorrhoids may be internal (inside the rectum) or external (around the anus).  Internal Hemorrhoids  Internal hemorrhoids are often painless, but they rarely cause bleeding.  The internal veins may stretch and fall down (prolapse) through the anus to the outside of the body.  The veins may then become irritated and painful.  External Hemorrhoids  External hemorrhoids can be easily seen or felt around the anal opening.  They are under the skin around the anus.  When the swollen veins are scratched or broken by straining, rubbing or wiping they sometimes bleed.  How Hemorrhoids Occur  Veins in the rectum and around the anus tend to swell under pressure.  Hemorrhoids can result from increased pressure in the veins of your anus or rectum.  Some sources of pressure are:  Straining to have a bowel movement because of constipation Waiting too long to have a bowel movement Coughing and sneezing often Sitting for extended periods of time, including on the toilet Diarrhea Obesity Trauma or injury to the anus Some liver diseases Stress Family history of hemorrhoids Pregnancy  Pregnant women should try to avoid becoming constipated, because they are more likely to have hemorrhoids during pregnancy.  In the last trimester of pregnancy, the enlarged uterus may press on blood vessels and causes hemorrhoids.  In addition, the strain of childbirth sometimes causes hemorrhoids after the birth.  Symptoms of Hemorrhoids  Some symptoms of hemorrhoids include: Swelling and/or a tender lump around the anus Itching, mild burning and bleeding around the anus Painful bowel movements with or without constipation Bright red blood covering the stool, on toilet paper or in the toilet bowel.   Symptoms usually go away within a few days.  Always talk to your doctor about any  bleeding to make sure it is not from some other causes.  Diagnosing and Treating Hemorrhoids  Diagnosis is made by an examination by your healthcare provider.  Special test can be performed by your doctor.    Most cases of hemorrhoids can be treated with: High-fiber diet: Eat more high-fiber foods, which help prevent constipation.  Ask for more detailed fiber information on types and sources of fiber from your healthcare provider. Fluids: Drink plenty of water.  This helps soften bowel movements so they are easier to pass. Sitz baths and cold packs: Sitting in lukewarm water two or three times a day for 15 minutes cleases the anal area and may relieve discomfort.  If the water is too hot, swelling around the anus will get worse.  Placing a cloth-covered ice pack on the anus for ten minutes four times a day can also help reduce selling.  Gently pushing a prolapsed hemorrhoid back inside after the bath or ice pack can be helpful. Medications: For mild discomfort, your healthcare provider may suggest over-the-counter pain medication or prescribe a cream or ointment for topical use.  The cream may contain witch hazel, zinc oxide or petroleum jelly.  Medicated suppositories are also a treatment option.  Always consult your doctor before applying medications or creams. Procedures and surgeries: There are also a number of procedures and surgeries to shrink or remove hemorrhoids in more serious cases.  Talk to your physician about these options.  You can often prevent hemorrhoids or keep them from becoming worse by maintaining a healthy lifestyle.  Eat a fiber-rich diet of fruits, vegetables and whole grains.  Also, drink plenty of water and exercise regularly.   2007, [employer] Doc.30  Levonorgestrel intrauterine device (IUD) What is this medicine? LEVONORGESTREL IUD (LEE voe nor jes trel) is a contraceptive (birth control) device. The device is placed inside the uterus by a healthcare  professional. It is used to prevent pregnancy and can also be used to treat heavy bleeding that occurs during your period. Depending on the device, it can be used for 3 to 5 years. This medicine may be used for other purposes; ask your health care provider or pharmacist if you have questions. COMMON BRAND NAME(S): Verda Cumins What should I tell my health care provider before I take this medicine? They need to know if you have any of these conditions: -abnormal Pap smear -cancer of the breast, uterus, or cervix -diabetes -endometritis -genital or pelvic infection now or in the past -have more than one sexual partner or your partner has more than one partner -heart disease -history of an ectopic or tubal pregnancy -immune system problems -IUD in place -liver disease or tumor -problems with blood clots or take blood-thinners -use intravenous drugs -uterus of unusual shape -vaginal bleeding that has not been explained -an unusual or allergic reaction to levonorgestrel, other hormones, silicone, or polyethylene, medicines, foods, dyes, or preservatives -pregnant or trying to get pregnant -breast-feeding How should I use this medicine? This device is placed inside the uterus by a health care professional. Talk to your pediatrician regarding the use of this medicine in children. Special care may be needed. Overdosage: If you think you have taken too much of this medicine contact a poison control center or emergency room at once. NOTE: This medicine is only for you. Do not share this medicine with others. What if I miss a dose? This does not apply. What may interact with this medicine? Do not take this medicine with any of the following medications: -amprenavir -bosentan -fosamprenavir This medicine may also interact with the following medications: -aprepitant -barbiturate medicines for inducing sleep or treating seizures -bexarotene -griseofulvin -medicines to treat  seizures like carbamazepine, ethotoin, felbamate, oxcarbazepine, phenytoin, topiramate -modafinil -pioglitazone -rifabutin -rifampin -rifapentine -some medicines to treat HIV infection like atazanavir, indinavir, lopinavir, nelfinavir, tipranavir, ritonavir -St. John's wort -warfarin This list may not describe all possible interactions. Give your health care provider a list of all the medicines, herbs, non-prescription drugs, or dietary supplements you use. Also tell them if you smoke, drink alcohol, or use illegal drugs. Some items may interact with your medicine. What should I watch for while using this medicine? Visit your doctor or health care professional for regular check ups. See your doctor if you or your partner has sexual contact with others, becomes HIV positive, or gets a sexual transmitted disease. This product does not protect you against HIV infection (AIDS) or other sexually transmitted diseases. You can check the placement of the IUD yourself by reaching up to the top of your vagina with clean fingers to feel the threads. Do not pull on the threads. It is a good habit to check placement after each menstrual period. Call your doctor right away if you feel more of the IUD than just the threads or if you cannot feel the threads at all. The IUD may come out by itself. You may become pregnant if the device comes out. If you notice that the IUD has come out use a backup birth control method like condoms and call your health care provider. Using tampons will not change the  position of the IUD and are okay to use during your period. What side effects may I notice from receiving this medicine? Side effects that you should report to your doctor or health care professional as soon as possible: -allergic reactions like skin rash, itching or hives, swelling of the face, lips, or tongue -fever, flu-like symptoms -genital sores -high blood pressure -no menstrual period for 6 weeks during  use -pain, swelling, warmth in the leg -pelvic pain or tenderness -severe or sudden headache -signs of pregnancy -stomach cramping -sudden shortness of breath -trouble with balance, talking, or walking -unusual vaginal bleeding, discharge -yellowing of the eyes or skin Side effects that usually do not require medical attention (report to your doctor or health care professional if they continue or are bothersome): -acne -breast pain -change in sex drive or performance -changes in weight -cramping, dizziness, or faintness while the device is being inserted -headache -irregular menstrual bleeding within first 3 to 6 months of use -nausea This list may not describe all possible side effects. Call your doctor for medical advice about side effects. You may report side effects to FDA at 1-800-FDA-1088. Where should I keep my medicine? This does not apply. NOTE: This sheet is a summary. It may not cover all possible information. If you have questions about this medicine, talk to your doctor, pharmacist, or health care provider.  2015, [employer]vier/Gold Standard. (2011-03-18 13:54:04)   Contraception Choices Contraception (birth control) is the use of any methods or devices to prevent pregnancy. Below are some methods to help avoid pregnancy. HORMONAL METHODS   Contraceptive implant. This is a thin, plastic tube containing progesterone hormone. It does not contain estrogen hormone. Your health care provider inserts the tube in the inner part of the upper arm. The tube can remain in place for up to 3 years. After 3 years, the implant must be removed. The implant prevents the ovaries from releasing an egg (ovulation), thickens the cervical mucus to prevent sperm from entering the uterus, and thins the lining of the inside of the uterus. Progesterone-only injections. These injections are given every 3 months by your health care provider to prevent pregnancy. This synthetic progesterone hormone stops  the ovaries from releasing eggs. It also thickens cervical mucus and changes the uterine lining. This makes it harder for sperm to survive in the uterus.  Birth control pills. These pills contain estrogen and progesterone hormone. They work by preventing the ovaries from releasing eggs (ovulation). They also cause the cervical mucus to thicken, preventing the sperm from entering the uterus. Birth control pills are prescribed by a health care provider.Birth control pills can also be used to treat heavy periods.  Minipill. This type of birth control pill contains only the progesterone hormone. They are taken every day of each month and must be prescribed by your health care provider.  Birth control patch. The patch contains hormones similar to those in birth control pills. It must be changed once a week and is prescribed by a health care provider.  Vaginal ring. The ring contains hormones similar to those in birth control pills. It is left in the vagina for 3 weeks, removed for 1 week, and then a new one is put back in place. The patient must be comfortable inserting and removing the ring from the vagina.A health care provider's prescription is necessary.  Emergency contraception. Emergency contraceptives prevent pregnancy after unprotected sexual intercourse. This pill can be taken right after sex or up to 5 days after unprotected sex.  It is most effective the sooner you take the pills after having sexual intercourse. Most emergency contraceptive pills are available without a prescription. Check with your pharmacist. Do not use emergency contraception as your only form of birth control. BARRIER METHODS   Female condom. This is a thin sheath (latex or rubber) that is worn over the penis during sexual intercourse. It can be used with spermicide to increase effectiveness.  Female condom. This is a soft, loose-fitting sheath that is put into the vagina before sexual intercourse.  Diaphragm. This is a  soft, latex, dome-shaped barrier that must be fitted by a health care provider. It is inserted into the vagina, along with a spermicidal jelly. It is inserted before intercourse. The diaphragm should be left in the vagina for 6 to 8 hours after intercourse.  Cervical cap. This is a round, soft, latex or plastic cup that fits over the cervix and must be fitted by a health care provider. The cap can be left in place for up to 48 hours after intercourse.  Sponge. This is a soft, circular piece of polyurethane foam. The sponge has spermicide in it. It is inserted into the vagina after wetting it and before sexual intercourse.  Spermicides. These are chemicals that kill or block sperm from entering the cervix and uterus. They come in the form of creams, jellies, suppositories, foam, or tablets. They do not require a prescription. They are inserted into the vagina with an applicator before having sexual intercourse. The process must be repeated every time you have sexual intercourse. INTRAUTERINE CONTRACEPTION  Intrauterine device (IUD). This is a T-shaped device that is put in a woman's uterus during a menstrual period to prevent pregnancy. There are 2 types:  Copper IUD. This type of IUD is wrapped in copper wire and is placed inside the uterus. Copper makes the uterus and fallopian tubes produce a fluid that kills sperm. It can stay in place for 10 years.  Hormone IUD. This type of IUD contains the hormone progestin (synthetic progesterone). The hormone thickens the cervical mucus and prevents sperm from entering the uterus, and it also thins the uterine lining to prevent implantation of a fertilized egg. The hormone can weaken or kill the sperm that get into the uterus. It can stay in place for 3-5 years, depending on which type of IUD is used. PERMANENT METHODS OF CONTRACEPTION  Female tubal ligation. This is when the woman's fallopian tubes are surgically sealed, tied, or blocked to prevent the egg  from traveling to the uterus.  Hysteroscopic sterilization. This involves placing a small coil or insert into each fallopian tube. Your doctor uses a technique called hysteroscopy to do the procedure. The device causes scar tissue to form. This results in permanent blockage of the fallopian tubes, so the sperm cannot fertilize the egg. It takes about 3 months after the procedure for the tubes to become blocked. You must use another form of birth control for these 3 months.  Female sterilization. This is when the female has the tubes that carry sperm tied off (vasectomy).This blocks sperm from entering the vagina during sexual intercourse. After the procedure, the man can still ejaculate fluid (semen). NATURAL PLANNING METHODS  Natural family planning. This is not having sexual intercourse or using a barrier method (condom, diaphragm, cervical cap) on days the woman could become pregnant.  Calendar method. This is keeping track of the length of each menstrual cycle and identifying when you are fertile.  Ovulation method. This is  avoiding sexual intercourse during ovulation.  Symptothermal method. This is avoiding sexual intercourse during ovulation, using a thermometer and ovulation symptoms.  Post-ovulation method. This is timing sexual intercourse after you have ovulated. Regardless of which type or method of contraception you choose, it is important that you use condoms to protect against the transmission of sexually transmitted infections (STIs). Talk with your health care provider about which form of contraception is most appropriate for you. Document Released: 02/15/2005 Document Revised: 02/20/2013 Document Reviewed: 08/10/2012 [facility] Patient Information 2015 [place], [place]. This information is not intended to replace advice given to you by your health care provider. Make sure you discuss any questions you have with your health care provider.

## 2014-10-23 NOTE — Progress Notes (Signed)
Subjective:  Kim [name] is a 34 y.o. G3P2002 at [redacted]w[redacted]d being seen today for ongoing prenatal care.  Patient reports extreme rectal pain.  Unable to sit for 2 days.  having cramps in gluteal muscles.  Denies vaginal pain or discharge.  Denies rectal or vaginal bleeding..  Contractions: Irritability.  Vag. Bleeding: None. Movement: Present. Denies leaking of fluid.   The following portions of the patient's history were reviewed and updated as appropriate: allergies, current medications, past family history, past medical history, past social history, past surgical history and problem list.   Objective:   Filed Vitals:   10/23/14 1031  Weight: 179 lb (81.194 kg)    Fetal Status: Fetal Heart Rate (bpm): 154   Movement: Present     General:  Alert, oriented and cooperative. Patient is in no acute distress.  Skin: Skin is warm and dry. No rash noted.   Cardiovascular: Normal heart rate noted  Respiratory: Normal respiratory effort, no problems with respiration noted  Abdomen: Soft, gravid, appropriate for gestational age. Pain/Pressure: Present     Pelvic: Vag. Bleeding: None Vag D/C Character: Thin   Cervical exam deferred        Extremities: Normal range of motion.  Edema: Trace  Mental Status: Normal mood and affect. Normal behavior. Normal judgment and thought content.  Rectum:  Severe pain on exam.  At 12 o'clock there is one small area of probably thombosed internal hemorrhoid.  Not large. Pain seems out of proportion to exam.  Urinalysis: Urine Protein: Negative Urine Glucose: Negative  Assessment and Plan:  Pregnancy: G3P2002 at [redacted]w[redacted]d  1. Supervision of other normal pregnancy, antepartum, third trimester Vagina inspected and not painful.  No lesion or mass.  2. Hemorrhoids affecting pregnancy or puerperium with antenatal complication Proctofoam and referral to gen surg Donut hole recommended - hydrocortisone-pramoxine (PROCTOFOAM HC) rectal foam; Place 1 applicator rectally 2 (two)  times daily.  Dispense: 10 g; Refill: 0 - Ambulatory referral to General Surgery  3. Intrauterine growth restriction, antenatal Size less than dates.  Will et f/u growth [place]. - US OB Follow Up; Future  Preterm labor symptoms and general obstetric precautions including but not limited to vaginal bleeding, contractions, leaking of fluid and fetal movement were reviewed in detail with the patient. Please refer to After Visit Summary for other counseling recommendations.  Information of contraception given.  Pt would like to try something other than condoms.  No Follow-up on file.   Guss Bunde, MD

## 2014-10-23 NOTE — Progress Notes (Signed)
Hemorrhoids very painful.  Called yesterday and was given Anusol cream but stating that it is getting worse and cannot sit.

## 2014-10-27 ENCOUNTER — Encounter (HOSPITAL_COMMUNITY): Payer: Self-pay | Admitting: *Deleted

## 2014-10-27 ENCOUNTER — Inpatient Hospital Stay (EMERGENCY_DEPARTMENT_HOSPITAL)
Admission: AD | Admit: 2014-10-27 | Discharge: 2014-10-27 | Disposition: A | Discharge status: Home / Self Care | Payer: 59 | Source: Ambulatory Visit | Attending: Obstetrics & Gynecology | Admitting: Obstetrics & Gynecology

## 2014-10-27 DIAGNOSIS — O2243 Hemorrhoids in pregnancy, third trimester: Secondary | ICD-10-CM | POA: Diagnosis not present

## 2014-10-27 DIAGNOSIS — O99613 Diseases of the digestive system complicating pregnancy, third trimester: Secondary | ICD-10-CM | POA: Diagnosis not present

## 2014-10-27 DIAGNOSIS — IMO0002 Reserved for concepts with insufficient information to code with codable children: Secondary | ICD-10-CM

## 2014-10-27 DIAGNOSIS — L03315 Cellulitis of perineum: Secondary | ICD-10-CM

## 2014-10-27 HISTORY — DX: Unspecified hemorrhoids: K64.9

## 2014-10-27 MED ORDER — LIDOCAINE HCL 2 % EX GEL
1.0000 "application " | Freq: Once | CUTANEOUS | Status: AC
  Administered 2014-10-27: 1 via TOPICAL
  Filled 2014-10-27: qty 5

## 2014-10-27 MED ORDER — OXYCODONE-ACETAMINOPHEN 5-325 MG PO TABS: 1.0000 | ORAL_TABLET | Freq: Four times a day (QID) | ORAL | Status: DC | PRN

## 2014-10-27 MED ORDER — CEPHALEXIN 500 MG PO CAPS: 500.0000 mg | ORAL_CAPSULE | Freq: Four times a day (QID) | ORAL | Status: DC

## 2014-10-27 MED ORDER — LIDOCAINE HCL 2 % EX GEL: 1.0000 "application " | CUTANEOUS | Status: DC | PRN

## 2014-10-27 MED ORDER — OXYCODONE-ACETAMINOPHEN 5-325 MG PO TABS
2.0000 | ORAL_TABLET | Freq: Once | ORAL | Status: AC
  Administered 2014-10-27: 2 via ORAL
  Filled 2014-10-27: qty 2

## 2014-10-27 NOTE — MAU Provider Note (Signed)
History     CSN: [id number]  Arrival date and time: 10/27/14 1311   First Provider Initiated Contact with Patient 10/27/14 1353      Chief Complaint  Patient presents with  . Hemorrhoids   HPI This is a 34 y.o. female at [redacted]w[redacted]d who presents with c/o worsening pain with hemorrhoids.  States the pain is worse than before and radiates into left hip.  Pain is aching and burning. Hurts more when she walks or lies on the left side. She was seen on  10/23/14 by Dr Gala Romney for the same pain  She saw a small possibly thrombosed hemorrhoid.  Patient states the pain and some swelling has progressed into left buttock and hip.   Proctofoam HC is not helping much, pain has gotten worse  RN Note:  Expand All Collapse All   Pt C/O severe pain with hemorrhoids, also pain in L hip. Has occasional lower abd pain, denies bleeding or LOF.          OB History    Gravida Para Term Preterm AB TAB SAB Ectopic Multiple Living   3 2 2       2       Past Medical History  Diagnosis Date  . Hemorrhoids     Past Surgical History  Procedure Laterality Date  . Cholecystectomy      Family History  Problem Relation Age of Onset  . Diabetes Mother     Social History  Substance Use Topics  . Smoking status: Never Smoker   . Smokeless tobacco: Never Used  . Alcohol Use: No    Allergies: No Known Allergies  Prescriptions prior to admission  Medication Sig Dispense Refill Last Dose  . acetaminophen (TYLENOL) 500 MG tablet Take 500 mg by mouth every 6 (six) hours as needed for mild pain or headache.   10/27/2014 at Unknown time  . hydrocortisone (ANUSOL-HC) 2.5 % rectal cream Place 1 application rectally 2 (two) times daily. 30 g 0 10/27/2014 at Unknown time  . hydrocortisone-pramoxine (PROCTOFOAM HC) rectal foam Place 1 applicator rectally 2 (two) times daily. 10 g 0 10/27/2014 at Unknown time  . Prenat-FeFum-FePo-FA-Omega 3 (CONCEPT DHA) 53.5-38-1 MG CAPS Take 1 capsule by mouth daily. 30 capsule  10 Past Month at Unknown time  . promethazine (PHENERGAN) 25 MG tablet Take 1 tablet (25 mg total) by mouth every 6 (six) hours as needed for nausea or vomiting. (Patient not taking: Reported on 10/27/2014) 30 tablet 2 Taking    Review of Systems  Constitutional: Negative for fever, chills and malaise/fatigue.  Gastrointestinal: Positive for abdominal pain (occasional). Negative for nausea, vomiting, diarrhea, constipation and blood in stool.  Genitourinary: Negative for dysuria, urgency and frequency.       Pain in rectum and radiating to left hip  Musculoskeletal: Positive for joint pain (left hip). Negative for back pain.  Neurological: Negative for weakness and headaches.   Physical Exam   Blood pressure 102/67, pulse 112, temperature 97.7 F (36.5 C), temperature source Oral, resp. rate 20, last menstrual period 03/07/2014.  Physical Exam  Constitutional: She is oriented to person, place, and time. She appears well-developed and well-nourished. She appears distressed (very uncomfortable, crying).  HENT:  Head: Normocephalic.  Cardiovascular: Normal rate.   Respiratory: Effort normal. No respiratory distress.  GI: Soft. She exhibits no distension and no mass. There is no tenderness. There is no rebound and no guarding.  Genitourinary:     Pain in rectum and radiating into left buttock  and hip (red) There is some induration just adjacent to rectum at 5:00 position (blue)  Musculoskeletal: Normal range of motion.  Neurological: She is alert and oriented to person, place, and time.  Skin: Skin is warm and dry.  Psychiatric: She has a normal mood and affect.   Fetal heart rate reactive No contractions  MAU Course  Procedures  MDM Consulted Dr Roselie Awkward who examined patient also He recommended using pain meds and getting surgery consultation We used topical Lidocaine and percocet with good relief  Assessment and Plan  A   SIUP at [redacted]w[redacted]d        Hemorrhoid with pain that is  disproportionate to exam        ? Rectal fistula or abscess process  P:  Discharge home       Note sent to get her referred to surgery this week      Followup in clinic    Medication List    TAKE these medications        acetaminophen 500 MG tablet  Commonly known as:  TYLENOL  Take 500 mg by mouth every 6 (six) hours as needed for mild pain or headache.     cephALEXin 500 MG capsule  Commonly known as:  KEFLEX  Take 1 capsule (500 mg total) by mouth 4 (four) times daily.     CONCEPT DHA 53.5-38-1 MG Caps  Take 1 capsule by mouth daily.     hydrocortisone 2.5 % rectal cream  Commonly known as:  ANUSOL-HC  Place 1 application rectally 2 (two) times daily.     hydrocortisone-pramoxine rectal foam  Commonly known as:  PROCTOFOAM HC  Place 1 applicator rectally 2 (two) times daily.     lidocaine 2 % jelly  Commonly known as:  XYLOCAINE  Apply 1 application topically as needed.     oxyCODONE-acetaminophen 5-325 MG per tablet  Commonly known as:  PERCOCET/ROXICET  Take 1-2 tablets by mouth every 6 (six) hours as needed.     promethazine 25 MG tablet  Commonly known as:  PHENERGAN  Take 1 tablet (25 mg total) by mouth every 6 (six) hours as needed for nausea or vomiting.         Hansel Feinstein 10/27/2014, 1:56 PM

## 2014-10-27 NOTE — Discharge Instructions (Signed)

## 2014-10-27 NOTE — MAU Note (Signed)
Pt C/O severe pain with hemorrhoids, also pain in L hip.  Has occasional lower abd pain, denies bleeding or LOF.

## 2014-10-27 NOTE — MAU Note (Signed)
Pt states she has been using proctozol cream & tylenol but it is not helping.  Has had hemorrhoid pain x 2 weeks.

## 2014-10-28 ENCOUNTER — Encounter: Payer: 59 | Admitting: Advanced Practice Midwife

## 2014-10-28 ENCOUNTER — Telehealth: Payer: Self-pay

## 2014-10-28 ENCOUNTER — Encounter (HOSPITAL_COMMUNITY): Payer: Self-pay | Admitting: Emergency Medicine

## 2014-10-28 DIAGNOSIS — Z3A32 32 weeks gestation of pregnancy: Secondary | ICD-10-CM | POA: Diagnosis present

## 2014-10-28 DIAGNOSIS — O98813 Other maternal infectious and parasitic diseases complicating pregnancy, third trimester: Secondary | ICD-10-CM | POA: Diagnosis present

## 2014-10-28 DIAGNOSIS — R339 Retention of urine, unspecified: Secondary | ICD-10-CM | POA: Diagnosis present

## 2014-10-28 DIAGNOSIS — O99613 Diseases of the digestive system complicating pregnancy, third trimester: Principal | ICD-10-CM | POA: Diagnosis present

## 2014-10-28 DIAGNOSIS — B952 Enterococcus as the cause of diseases classified elsewhere: Secondary | ICD-10-CM | POA: Diagnosis present

## 2014-10-28 DIAGNOSIS — O3413 Maternal care for benign tumor of corpus uteri, third trimester: Secondary | ICD-10-CM | POA: Diagnosis present

## 2014-10-28 DIAGNOSIS — K611 Rectal abscess: Secondary | ICD-10-CM | POA: Diagnosis present

## 2014-10-28 DIAGNOSIS — K5900 Constipation, unspecified: Secondary | ICD-10-CM | POA: Diagnosis present

## 2014-10-28 DIAGNOSIS — O9989 Other specified diseases and conditions complicating pregnancy, childbirth and the puerperium: Secondary | ICD-10-CM | POA: Diagnosis present

## 2014-10-28 DIAGNOSIS — D259 Leiomyoma of uterus, unspecified: Secondary | ICD-10-CM | POA: Diagnosis present

## 2014-10-28 DIAGNOSIS — Z9049 Acquired absence of other specified parts of digestive tract: Secondary | ICD-10-CM | POA: Diagnosis present

## 2014-10-28 LAB — CBC WITH DIFFERENTIAL/PLATELET
BASOS PCT: 0 % (ref 0–1)
Basophils Absolute: 0 10*3/uL (ref 0.0–0.1)
EOS ABS: 0.1 10*3/uL (ref 0.0–0.7)
Eosinophils Relative: 1 % (ref 0–5)
HEMATOCRIT: 32.5 % — AB (ref 36.0–46.0)
HEMOGLOBIN: 10.7 g/dL — AB (ref 12.0–15.0)
LYMPHS ABS: 1.3 10*3/uL (ref 0.7–4.0)
Lymphocytes Relative: 9 % — ABNORMAL LOW (ref 12–46)
MCH: 28.1 pg (ref 26.0–34.0)
MCHC: 32.9 g/dL (ref 30.0–36.0)
MCV: 85.3 fL (ref 78.0–100.0)
Monocytes Absolute: 1 10*3/uL (ref 0.1–1.0)
Monocytes Relative: 7 % (ref 3–12)
NEUTROS ABS: 12.7 10*3/uL — AB (ref 1.7–7.7)
NEUTROS PCT: 83 % — AB (ref 43–77)
Platelets: 218 10*3/uL (ref 150–400)
RBC: 3.81 MIL/uL — AB (ref 3.87–5.11)
RDW: 13.6 % (ref 11.5–15.5)
WBC: 15.1 10*3/uL — AB (ref 4.0–10.5)

## 2014-10-28 LAB — COMPREHENSIVE METABOLIC PANEL
ALBUMIN: 2.5 g/dL — AB (ref 3.5–5.0)
ALK PHOS: 191 U/L — AB (ref 38–126)
ALT: 18 U/L (ref 14–54)
AST: 24 U/L (ref 15–41)
Anion gap: 7 (ref 5–15)
CALCIUM: 8.7 mg/dL — AB (ref 8.9–10.3)
CO2: 21 mmol/L — AB (ref 22–32)
CREATININE: 0.49 mg/dL (ref 0.44–1.00)
Chloride: 108 mmol/L (ref 101–111)
GFR calc Af Amer: >60 mL/min (ref >60)
GFR calc non Af Amer: >60 mL/min (ref >60)
GLUCOSE: 123 mg/dL — AB (ref 65–99)
Potassium: 3.5 mmol/L (ref 3.5–5.1)
SODIUM: 136 mmol/L (ref 135–145)
Total Bilirubin: 0.5 mg/dL (ref 0.3–1.2)
Total Protein: 6.6 g/dL (ref 6.5–8.1)

## 2014-10-28 MED ORDER — FENTANYL CITRATE (PF) 100 MCG/2ML IJ SOLN
50.0000 ug | Freq: Once | INTRAMUSCULAR | Status: AC
Start: 2014-10-28 — End: 2014-10-28
  Administered 2014-10-28: 50 ug via NASAL

## 2014-10-28 MED ORDER — FENTANYL CITRATE (PF) 100 MCG/2ML IJ SOLN
INTRAMUSCULAR | Status: AC
  Filled 2014-10-28: qty 2

## 2014-10-28 NOTE — ED Notes (Addendum)
Pt. reports worsening rectal pain for several weeks , history of hemorrhoids , pt. is [redacted] weeks pregnant , denies fever or chills. Spouse reported pt. has rectal abscess .

## 2014-10-28 NOTE — Telephone Encounter (Signed)
Schedule pt to see [facility] for General Surgery for today Aug. 29 at 3:30p with Dr. Arvin Collard. [place]

## 2014-10-29 ENCOUNTER — Encounter (HOSPITAL_COMMUNITY): Admission: EM | Disposition: A | Discharge status: Home Health | Payer: Self-pay | Source: Home / Self Care

## 2014-10-29 ENCOUNTER — Inpatient Hospital Stay (HOSPITAL_COMMUNITY)
Admission: EM | Admit: 2014-10-29 | Discharge: 2014-11-01 | DRG: 781 | Disposition: A | Discharge status: Home Health | Payer: 59 | Attending: Surgery | Admitting: Surgery

## 2014-10-29 ENCOUNTER — Encounter (HOSPITAL_COMMUNITY): Payer: Self-pay | Admitting: *Deleted

## 2014-10-29 ENCOUNTER — Observation Stay (HOSPITAL_COMMUNITY): Payer: 59 | Admitting: Anesthesiology

## 2014-10-29 DIAGNOSIS — K611 Rectal abscess: Secondary | ICD-10-CM

## 2014-10-29 DIAGNOSIS — Z9049 Acquired absence of other specified parts of digestive tract: Secondary | ICD-10-CM | POA: Diagnosis present

## 2014-10-29 DIAGNOSIS — O98813 Other maternal infectious and parasitic diseases complicating pregnancy, third trimester: Secondary | ICD-10-CM | POA: Diagnosis present

## 2014-10-29 DIAGNOSIS — O99613 Diseases of the digestive system complicating pregnancy, third trimester: Secondary | ICD-10-CM | POA: Diagnosis present

## 2014-10-29 DIAGNOSIS — O3413 Maternal care for benign tumor of corpus uteri, third trimester: Secondary | ICD-10-CM | POA: Diagnosis present

## 2014-10-29 DIAGNOSIS — K612 Anorectal abscess: Secondary | ICD-10-CM | POA: Diagnosis present

## 2014-10-29 DIAGNOSIS — K5900 Constipation, unspecified: Secondary | ICD-10-CM | POA: Diagnosis present

## 2014-10-29 DIAGNOSIS — R338 Other retention of urine: Secondary | ICD-10-CM | POA: Diagnosis present

## 2014-10-29 DIAGNOSIS — B952 Enterococcus as the cause of diseases classified elsewhere: Secondary | ICD-10-CM | POA: Diagnosis present

## 2014-10-29 DIAGNOSIS — Z3A32 32 weeks gestation of pregnancy: Secondary | ICD-10-CM | POA: Diagnosis present

## 2014-10-29 DIAGNOSIS — Z3A33 33 weeks gestation of pregnancy: Secondary | ICD-10-CM

## 2014-10-29 DIAGNOSIS — O9989 Other specified diseases and conditions complicating pregnancy, childbirth and the puerperium: Secondary | ICD-10-CM | POA: Diagnosis present

## 2014-10-29 DIAGNOSIS — D259 Leiomyoma of uterus, unspecified: Secondary | ICD-10-CM | POA: Diagnosis present

## 2014-10-29 DIAGNOSIS — O98913 Unspecified maternal infectious and parasitic disease complicating pregnancy, third trimester: Secondary | ICD-10-CM

## 2014-10-29 DIAGNOSIS — R339 Retention of urine, unspecified: Secondary | ICD-10-CM | POA: Diagnosis present

## 2014-10-29 HISTORY — PX: INCISION AND DRAINAGE PERIRECTAL ABSCESS: SHX1804

## 2014-10-29 SURGERY — INCISION AND DRAINAGE, ABSCESS, PERIRECTAL
Anesthesia: Spinal | Site: Rectum

## 2014-10-29 MED ORDER — BUPIVACAINE HCL (PF) 0.75 % IJ SOLN
INTRAMUSCULAR | Status: DC | PRN
  Administered 2014-10-29: 1.4 mL via INTRATHECAL

## 2014-10-29 MED ORDER — LACTATED RINGERS IV SOLN: INTRAVENOUS | Status: DC

## 2014-10-29 MED ORDER — LIDOCAINE HCL (CARDIAC) 20 MG/ML IV SOLN
INTRAVENOUS | Status: DC | PRN
  Administered 2014-10-29: 40 mg via INTRAVENOUS

## 2014-10-29 MED ORDER — LACTATED RINGERS IV SOLN
INTRAVENOUS | Status: DC
  Administered 2014-10-29 (×2): via INTRAVENOUS

## 2014-10-29 MED ORDER — MORPHINE SULFATE (PF) 2 MG/ML IV SOLN
2.0000 mg | INTRAVENOUS | Status: DC | PRN
  Administered 2014-10-29 (×3): 2 mg via INTRAVENOUS
  Filled 2014-10-29 (×3): qty 1

## 2014-10-29 MED ORDER — CEFAZOLIN SODIUM-DEXTROSE 2-3 GM-% IV SOLR
2.0000 g | Freq: Three times a day (TID) | INTRAVENOUS | Status: DC
  Administered 2014-10-29 – 2014-11-01 (×11): 2 g via INTRAVENOUS
  Filled 2014-10-29 (×14): qty 50

## 2014-10-29 MED ORDER — DEXTROSE-NACL 5-0.45 % IV SOLN
INTRAVENOUS | Status: DC
  Administered 2014-10-29 – 2014-10-31 (×5): via INTRAVENOUS
  Administered 2014-10-31: 100 mL/h via INTRAVENOUS

## 2014-10-29 MED ORDER — GLYCOPYRROLATE 0.2 MG/ML IJ SOLN
INTRAMUSCULAR | Status: AC
  Filled 2014-10-29: qty 2

## 2014-10-29 MED ORDER — KETOROLAC TROMETHAMINE 30 MG/ML IJ SOLN
INTRAMUSCULAR | Status: AC
  Filled 2014-10-29: qty 1

## 2014-10-29 MED ORDER — ONDANSETRON HCL 4 MG/2ML IJ SOLN
INTRAMUSCULAR | Status: AC
  Filled 2014-10-29: qty 2

## 2014-10-29 MED ORDER — HYDROCODONE-ACETAMINOPHEN 5-325 MG PO TABS
1.0000 | ORAL_TABLET | ORAL | Status: DC | PRN
  Administered 2014-10-29 – 2014-10-31 (×8): 2 via ORAL
  Administered 2014-10-31: 1 via ORAL
  Administered 2014-11-01 (×2): 2 via ORAL
  Filled 2014-10-29 (×3): qty 2
  Filled 2014-10-29: qty 1
  Filled 2014-10-29 (×7): qty 2

## 2014-10-29 MED ORDER — ONDANSETRON HCL 4 MG/2ML IJ SOLN
INTRAMUSCULAR | Status: DC | PRN
  Administered 2014-10-29: 4 mg via INTRAVENOUS

## 2014-10-29 MED ORDER — FENTANYL CITRATE (PF) 100 MCG/2ML IJ SOLN: 25.0000 ug | INTRAMUSCULAR | Status: DC | PRN

## 2014-10-29 MED ORDER — ACETAMINOPHEN 325 MG PO TABS
650.0000 mg | ORAL_TABLET | ORAL | Status: DC | PRN
  Administered 2014-10-29: 650 mg via ORAL
  Filled 2014-10-29: qty 2

## 2014-10-29 MED ORDER — NEOSTIGMINE METHYLSULFATE 10 MG/10ML IV SOLN
INTRAVENOUS | Status: AC
  Filled 2014-10-29: qty 1

## 2014-10-29 MED ORDER — ROCURONIUM BROMIDE 50 MG/5ML IV SOLN
INTRAVENOUS | Status: AC
  Filled 2014-10-29: qty 1

## 2014-10-29 MED ORDER — LIDOCAINE HCL (CARDIAC) 20 MG/ML IV SOLN
INTRAVENOUS | Status: AC
  Filled 2014-10-29: qty 5

## 2014-10-29 MED ORDER — FENTANYL CITRATE (PF) 250 MCG/5ML IJ SOLN
INTRAMUSCULAR | Status: AC
  Filled 2014-10-29: qty 5

## 2014-10-29 MED ORDER — PROPOFOL 10 MG/ML IV BOLUS
INTRAVENOUS | Status: DC | PRN
  Administered 2014-10-29 (×3): 20 mg via INTRAVENOUS
  Administered 2014-10-29: 10 mg via INTRAVENOUS
  Administered 2014-10-29 (×2): 20 mg via INTRAVENOUS

## 2014-10-29 MED ORDER — BUPIVACAINE LIPOSOME 1.3 % IJ SUSP
20.0000 mL | INTRAMUSCULAR | Status: AC
  Administered 2014-10-29: 20 mL
  Filled 2014-10-29: qty 20

## 2014-10-29 MED ORDER — PHENYLEPHRINE HCL 10 MG/ML IJ SOLN
INTRAMUSCULAR | Status: DC | PRN
Start: 2014-10-29 — End: 2014-10-29
  Administered 2014-10-29 (×2): 80 ug via INTRAVENOUS
  Administered 2014-10-29: 40 ug via INTRAVENOUS
  Administered 2014-10-29: 80 ug via INTRAVENOUS
  Administered 2014-10-29: 120 ug via INTRAVENOUS

## 2014-10-29 MED ORDER — DEXAMETHASONE SODIUM PHOSPHATE 4 MG/ML IJ SOLN
INTRAMUSCULAR | Status: AC
  Filled 2014-10-29: qty 2

## 2014-10-29 MED ORDER — CEFAZOLIN SODIUM-DEXTROSE 2-3 GM-% IV SOLR
INTRAVENOUS | Status: AC
  Filled 2014-10-29: qty 50

## 2014-10-29 MED ORDER — FENTANYL CITRATE (PF) 100 MCG/2ML IJ SOLN
INTRAMUSCULAR | Status: DC | PRN
  Administered 2014-10-29: 50 ug via INTRAVENOUS
  Administered 2014-10-29: 25 ug via INTRAVENOUS

## 2014-10-29 MED ORDER — MORPHINE SULFATE (PF) 2 MG/ML IV SOLN
1.0000 mg | INTRAVENOUS | Status: DC | PRN
  Administered 2014-10-30 (×4): 2 mg via INTRAVENOUS
  Administered 2014-10-31: 4 mg via INTRAVENOUS
  Filled 2014-10-29 (×2): qty 1
  Filled 2014-10-29: qty 2
  Filled 2014-10-29: qty 1
  Filled 2014-10-29: qty 2

## 2014-10-29 MED ORDER — PROMETHAZINE HCL 25 MG/ML IJ SOLN: 6.2500 mg | INTRAMUSCULAR | Status: DC | PRN

## 2014-10-29 MED ORDER — PROPOFOL 10 MG/ML IV BOLUS
INTRAVENOUS | Status: AC
  Filled 2014-10-29: qty 20

## 2014-10-29 MED ORDER — 0.9 % SODIUM CHLORIDE (POUR BTL) OPTIME
TOPICAL | Status: DC | PRN
  Administered 2014-10-29: 1000 mL

## 2014-10-29 SURGICAL SUPPLY — 35 items
BLADE 15 SAFETY STRL DISP (BLADE) ×2 IMPLANT
BNDG GAUZE ELAST 4 BULKY (GAUZE/BANDAGES/DRESSINGS) ×2 IMPLANT
CANISTER SUCTION 2500CC (MISCELLANEOUS) ×2 IMPLANT
COVER SURGICAL LIGHT HANDLE (MISCELLANEOUS) ×2 IMPLANT
DRAPE UTILITY XL STRL (DRAPES) ×4 IMPLANT
DRSG PAD ABDOMINAL 8X10 ST (GAUZE/BANDAGES/DRESSINGS) ×2 IMPLANT
ELECT CAUTERY BLADE 6.4 (BLADE) ×2 IMPLANT
ELECT REM PT RETURN 9FT ADLT (ELECTROSURGICAL) ×2
ELECTRODE REM PT RTRN 9FT ADLT (ELECTROSURGICAL) ×1 IMPLANT
GAUZE PACKING IODOFORM 1 (PACKING) IMPLANT
GAUZE SPONGE 4X4 12PLY STRL (GAUZE/BANDAGES/DRESSINGS) ×2 IMPLANT
GLOVE SURG SIGNA 7.5 PF LTX (GLOVE) ×2 IMPLANT
GOWN STRL REUS W/ TWL LRG LVL3 (GOWN DISPOSABLE) ×1 IMPLANT
GOWN STRL REUS W/ TWL XL LVL3 (GOWN DISPOSABLE) ×1 IMPLANT
GOWN STRL REUS W/TWL LRG LVL3 (GOWN DISPOSABLE) ×1
GOWN STRL REUS W/TWL XL LVL3 (GOWN DISPOSABLE) ×1
KIT BASIN OR (CUSTOM PROCEDURE TRAY) ×2 IMPLANT
KIT ROOM TURNOVER OR (KITS) ×2 IMPLANT
NEEDLE 18GX1X1/2 (RX/OR ONLY) (NEEDLE) ×2 IMPLANT
NEEDLE 22X1 1/2 (OR ONLY) (NEEDLE) ×2 IMPLANT
NS IRRIG 1000ML POUR BTL (IV SOLUTION) ×2 IMPLANT
PACK LITHOTOMY IV (CUSTOM PROCEDURE TRAY) ×2 IMPLANT
PAD ARMBOARD 7.5X6 YLW CONV (MISCELLANEOUS) IMPLANT
PENCIL BUTTON HOLSTER BLD 10FT (ELECTRODE) ×2 IMPLANT
SPONGE GAUZE 4X4 12PLY STER LF (GAUZE/BANDAGES/DRESSINGS) ×2 IMPLANT
SPONGE LAP 18X18 X RAY DECT (DISPOSABLE) ×2 IMPLANT
SWAB COLLECTION DEVICE MRSA (MISCELLANEOUS) ×2 IMPLANT
SYR BULB 3OZ (MISCELLANEOUS) ×2 IMPLANT
SYR CONTROL 10ML LL (SYRINGE) ×2 IMPLANT
TOWEL OR 17X24 6PK STRL BLUE (TOWEL DISPOSABLE) ×2 IMPLANT
TOWEL OR 17X26 10 PK STRL BLUE (TOWEL DISPOSABLE) ×2 IMPLANT
TUBE ANAEROBIC SPECIMEN COL (MISCELLANEOUS) ×2 IMPLANT
TUBE CONNECTING 12X1/4 (SUCTIONS) ×2 IMPLANT
UNDERPAD 30X30 INCONTINENT (UNDERPADS AND DIAPERS) ×2 IMPLANT
YANKAUER SUCT BULB TIP NO VENT (SUCTIONS) ×2 IMPLANT

## 2014-10-29 NOTE — ED Notes (Signed)
Pt c/o urinary hesitancy x 3-4 days. In and out cath completed, 521ml emptied from bladder.

## 2014-10-29 NOTE — Progress Notes (Signed)
Baby nurse Limmie Patricia RN at bedside and completing fetal monitoring/ states she will return this evening unless called back sooner. Also, notified Dr.Hollis of pt's BP. Pt asymptomatic. No new orders at this time. Dr. Smith Robert states to notify him if mean pressure drops below 60.

## 2014-10-29 NOTE — Transfer of Care (Signed)
Immediate Anesthesia Transfer of Care Note  Patient: Kim [name]  Procedure(s) Performed: Procedure(s): IRRIGATION AND DEBRIDEMENT PERIRECTAL ABSCESS (N/A)  Patient Location: PACU  Anesthesia Type:Spinal  Level of Consciousness: awake, alert , oriented and patient cooperative  Airway & Oxygen Therapy: Patient Spontanous Breathing  Post-op Assessment: Report given to RN and Post -op Vital signs reviewed and stable  Post vital signs: Reviewed and stable  Last Vitals:  Filed Vitals:   10/29/14 1015  BP: 87/54  Pulse: 77  Temp: 37 C  Resp: 13    Complications: No apparent anesthesia complications

## 2014-10-29 NOTE — Progress Notes (Signed)
Patient had a few questions prior to surgery.  She's ready to proceed.  She's signed her consent form.  Presently getting washed up and pending being taken downstairs to pre-op holding.  Jomarie Longs, PA-C General Surgery [facility] [phone]

## 2014-10-29 NOTE — Op Note (Signed)
IRRIGATION AND DEBRIDEMENT PERIRECTAL ABSCESS  Procedure Note  Kim [name] 10/29/2014   Pre-op Diagnosis: perirectal abscess     Post-op Diagnosis: same  Procedure(s): INCISION AND DRAINAGE PERIRECTAL ABSCESS  Surgeon(s): Coralie Keens, MD  Anesthesia: General  Staff:  Circulator: Donneta Romberg, RN Scrub Person: Romero Liner, CST; Candie Mile, RN Circulator Assistant: Harrel Lemon, RN  Estimated Blood Loss: Minimal               Indications:  This is a [redacted] week pregnant female who presents with several weeks of worsening perianal discomfort. She presents for incision and drainage of a perirectal abscess  Findings: The patient was found to have a large deep perirectal abscess with a large amount of purulence. Cultures were obtained  Procedure: The patient was taken to the operating room. Anesthesia performed a spinal on the patient. The patient was then placed in lithotomy position. Her perianal area was prepped and draped in usual sterile fashion. An area of firmness was identified in the left perianal location area I anesthetized the area with experel and made an incision with a scalpel. I entered a very large, deep abscess cavity was contained a large amount of purulence. Cultures were obtained. I irrigated the large cavity with saline. I performed a rectal exam and saw no communication with the abscess cavity. I then irrigated further with saline. I anesthetized further with the injectable medication. I achieved hemostasis with the cautery. I then packed the large cavity with a Betadine soaked Kerlix. The patient tolerated procedure well. All the counts were correct at the end procedure. The patient was then taken in a stable condition to the recovery room.          [name]   Date: 10/29/2014  Time: 10:10 AM

## 2014-10-29 NOTE — ED Notes (Signed)
This note also relates to the following rows which could not be included: BP - Cannot attach notes to unvalidated device data Pulse Rate - Cannot attach notes to unvalidated device data SpO2 - Cannot attach notes to unvalidated device data Reported to Dr Nehemiah Settle that patient has reactive category 1 NST. No pain from UCs. Patient reports good fetal movement, no vaginal bleeding or LOF. Reported that surgery consult will be done. Dr Nehemiah Settle ordered NST q shift if patient is admitted and NST after surgery in PACU.

## 2014-10-29 NOTE — ED Notes (Signed)
Patient is resting comfortably. 

## 2014-10-29 NOTE — Anesthesia Postprocedure Evaluation (Signed)
  Anesthesia Post-op Note  Patient: Kim [name]  Procedure(s) Performed: Procedure(s): IRRIGATION AND DEBRIDEMENT PERIRECTAL ABSCESS (N/A)  Patient Location: PACU  Anesthesia Type:Spinal  Level of Consciousness: awake, alert  and oriented  Airway and Oxygen Therapy: Patient Spontanous Breathing  Post-op Pain: none  Post-op Assessment: Post-op Vital signs reviewed and Patient's Cardiovascular Status Stable LLE Motor Response: No movement due to regional block LLE Sensation: Decreased RLE Motor Response: No movement due to regional block RLE Sensation: Decreased L Sensory Level: L5-Outer lower leg, top of foot, great toe R Sensory Level: L5-Outer lower leg, top of foot, great toe  Post-op Vital Signs: Reviewed and stable  Last Vitals:  Filed Vitals:   10/29/14 1330  BP: 108/67  Pulse: 90  Temp: 37.3 C  Resp:     Complications: No apparent anesthesia complications

## 2014-10-29 NOTE — Anesthesia Preprocedure Evaluation (Addendum)
Anesthesia Evaluation  Patient identified by MRN, date of birth, ID band Patient awake    Reviewed: Allergy & Precautions, NPO status , Patient's Chart, lab work & pertinent test results  Airway Mallampati: II  TM Distance: >3 FB Neck ROM: Full    Dental  (+) Teeth Intact   Pulmonary neg pulmonary ROS,  breath sounds clear to auscultation        Cardiovascular negative cardio ROS  Rhythm:Regular Rate:Normal     Neuro/Psych negative neurological ROS  negative psych ROS   GI/Hepatic negative GI ROS, Neg liver ROS,   Endo/Other  negative endocrine ROS  Renal/GU negative Renal ROS     Musculoskeletal negative musculoskeletal ROS (+)   Abdominal   Peds negative pediatric ROS (+)  Hematology negative hematology ROS (+)   Anesthesia Other Findings   Reproductive/Obstetrics (+) Pregnancy                            Anesthesia Physical Anesthesia Plan  ASA: II  Anesthesia Plan: Spinal   Post-op Pain Management:    Induction:   Airway Management Planned: Natural Airway and Simple Face Mask  Additional Equipment:   Intra-op Plan:   Post-operative Plan:   Informed Consent: I have reviewed the patients History and Physical, chart, labs and discussed the procedure including the risks, benefits and alternatives for the proposed anesthesia with the patient or authorized representative who has indicated his/her understanding and acceptance.   Dental advisory given  Plan Discussed with: CRNA  Anesthesia Plan Comments: (Discussed with Dr. Gala Romney, ok to proceed to OR. Fetal monitoring in preop and postop only. )       Anesthesia Quick Evaluation

## 2014-10-29 NOTE — Progress Notes (Signed)
Re: Fetal Monitoring.  Discussed fetal monitoring with OB response RN and Dr. Gala Romney, the decision was made to perform NST prior to surgery and in the PACU. No intraoperative monitoring will be performed. According to [facility] RN, fetal monitoring strip is reassuring and stable. No further intervention is indicated at this time. Surgeon and OR staff aware.   Crissie Sickles Smith Robert, MD, [facility] Anesthesiology

## 2014-10-29 NOTE — ED Notes (Signed)
Rapid OB enroute; Pt placed on toco monitor

## 2014-10-29 NOTE — Progress Notes (Signed)
Patient is inpatient here on the [place] and is in peri op area for drainage of rectal abscess.  Pre op EFM was performed and post op EFM is in progress.  FHR category I, see labor flowsheet.

## 2014-10-29 NOTE — ED Notes (Signed)
Pt c/o rectal pain, was seen in MAU, and referred to general surgeon at [facility]. Pt last took oxycodone at 2300 without relief. Pt also c/o urinary hesitancy x 3-4 days.

## 2014-10-29 NOTE — ED Provider Notes (Signed)
CSN: [id number]     Arrival date & time 10/28/14  2044 History  This chart was scribe for Monya Kozakiewicz, MD by Judithann Sauger, ED Scribe. The patient was seen in room D34C/D34C and the patient's care was started at 12:34 AM.     Chief Complaint  Patient presents with  . Rectal Pain   Patient is a 34 y.o. female presenting with abscess. The history is provided by the patient. No language interpreter was used.  Abscess Location:  Ano-genital Ano-genital abscess location: perirectal. Abscess quality: fluctuance   Abscess quality: not draining   Red streaking: yes   Progression:  Worsening Chronicity:  New Context: not diabetes   Relieved by:  Nothing Worsened by:  Nothing tried Ineffective treatments:  None tried Associated symptoms: no fever   Risk factors: no family hx of MRSA    HPI Comments: Kim [name] is a 34 y.o. female who is [redacted] weeks pregnant who presents to the Emergency Department for an I&D for a peri-rectal abscess onset about 2 weeks ago. She was referred here by her Surgeon after being seen at the [facility] yesterday.   Past Medical History  Diagnosis Date  . Hemorrhoids    Past Surgical History  Procedure Laterality Date  . Cholecystectomy     Family History  Problem Relation Age of Onset  . Diabetes Mother    Social History  Substance Use Topics  . Smoking status: Never Smoker   . Smokeless tobacco: Never Used  . Alcohol Use: No   OB History    Gravida Para Term Preterm AB TAB SAB Ectopic Multiple Living   3 2 2       2      Review of Systems  Constitutional: Negative for fever and chills.  Skin:       Abscess  All other systems reviewed and are negative.     Allergies  Review of patient's allergies indicates no known allergies.  Home Medications   Prior to Admission medications   Medication Sig Start Date End Date Taking? Authorizing Provider  acetaminophen (TYLENOL) 500 MG tablet Take 500 mg by mouth every 6 (six) hours as needed  for mild pain or headache.    Historical Provider, MD  cephALEXin (KEFLEX) 500 MG capsule Take 1 capsule (500 mg total) by mouth 4 (four) times daily. 10/27/14   Seabron Spates, CNM  hydrocortisone (ANUSOL-HC) 2.5 % rectal cream Place 1 application rectally 2 (two) times daily. 10/22/14   Emily Filbert, MD  hydrocortisone-pramoxine (PROCTOFOAM HC) rectal foam Place 1 applicator rectally 2 (two) times daily. 10/23/14   Guss Bunde, MD  lidocaine (XYLOCAINE) 2 % jelly Apply 1 application topically as needed. 10/27/14   Seabron Spates, CNM  oxyCODONE-acetaminophen (PERCOCET/ROXICET) 5-325 MG per tablet Take 1-2 tablets by mouth every 6 (six) hours as needed. 10/27/14   Seabron Spates, CNM  Prenat-FeFum-FePo-FA-Omega 3 (CONCEPT DHA) 53.5-38-1 MG CAPS Take 1 capsule by mouth daily. 08/05/14   Guss Bunde, MD  promethazine (PHENERGAN) 25 MG tablet Take 1 tablet (25 mg total) by mouth every 6 (six) hours as needed for nausea or vomiting. Patient not taking: Reported on 10/27/2014 05/27/14   Emily Filbert, MD   BP 112/66 mmHg  Pulse 121  Temp(Src) 98 F (36.7 C) (Oral)  SpO2 97%  LMP 03/07/2014 Physical Exam  Constitutional: She is oriented to person, place, and time. She appears well-developed and well-nourished. No distress.  HENT:  Head: Normocephalic and  atraumatic.  Mouth/Throat: Oropharynx is clear and moist.  Eyes: Conjunctivae and EOM are normal. Pupils are equal, round, and reactive to light.  Neck: Normal range of motion. Neck supple. No tracheal deviation present.  Cardiovascular: Normal rate, regular rhythm and normal heart sounds.   Pulmonary/Chest: Effort normal and breath sounds normal. No respiratory distress. She has no wheezes. She has no rales.  Abdominal: Soft. Bowel sounds are normal. There is no tenderness. There is no rebound and no guarding.  Good fetal movement   Musculoskeletal: Normal range of motion.  Neurological: She is alert and oriented to person, place, and  time.  Skin: Skin is warm and dry.  Abscess tracks down about an inch into left Gluteal cleft and Anal sphincter.It is warm.  Psychiatric: She has a normal mood and affect. Her behavior is normal.  Nursing note and vitals reviewed.   ED Course  Procedures (including critical care time) DIAGNOSTIC STUDIES: Oxygen Saturation is 97% on RA, adequate by my interpretation.    COORDINATION OF CARE: 12:39 AM- Pt advised of plan for treatment and pt agrees.    Labs Review Labs Reviewed  CBC WITH DIFFERENTIAL/PLATELET - Abnormal; Notable for the following:    WBC 15.1 (*)    RBC 3.81 (*)    Hemoglobin 10.7 (*)    HCT 32.5 (*)    Neutrophils Relative % 83 (*)    Neutro Abs 12.7 (*)    Lymphocytes Relative 9 (*)    All other components within normal limits  COMPREHENSIVE METABOLIC PANEL - Abnormal; Notable for the following:    CO2 21 (*)    Glucose, Bld 123 (*)    BUN <5 (*)    Calcium 8.7 (*)    Albumin 2.5 (*)    Alkaline Phosphatase 191 (*)    All other components within normal limits    Imaging Review No results found. I have personally reviewed and evaluated these images and lab results as part of my medical decision-making.   EKG Interpretation None      MDM   Final diagnoses:  None    Medications  dextrose 5 %-0.45 % sodium chloride infusion ( Intravenous New Bag/Given 10/29/14 0302)  morphine 2 MG/ML injection 2 mg (2 mg Intravenous Given 10/29/14 0300)  fentaNYL (SUBLIMAZE) injection 50 mcg (50 mcg Nasal Given 10/28/14 2115)     I personally performed the services described in this documentation, which was scribed in my presence. The recorded information has been reviewed and is accurate.    Veatrice Kells, MD 10/29/14 [phone]

## 2014-10-29 NOTE — H&P (Signed)
Kim [name] is an 34 y.o. female.   Chief Complaint: buttock pain HPI:  34 yo female 63 wga presents with 2 weeks of worsening buttock pain. Pain is constant and much worse over the last 3 days. She has had BM yesterday. She is not eating well because of the pain. She denies fevers. Over the past 24h she has been to multiple ER/hospitals/consultations to arrive at [facility] ER.  Past Medical History  Diagnosis Date  . Hemorrhoids     Past Surgical History  Procedure Laterality Date  . Cholecystectomy      Family History  Problem Relation Age of Onset  . Diabetes Mother    Social History:  reports that she has never smoked. She has never used smokeless tobacco. She reports that she does not drink alcohol or use illicit drugs.  Allergies: No Known Allergies   (Not in a hospital admission)  Results for orders placed or performed during the hospital encounter of 10/29/14 (from the past 48 hour(s))  CBC with Differential     Status: Abnormal   Collection Time: 10/28/14  9:32 PM  Result Value Ref Range   WBC 15.1 (H) 4.0 - 10.5 K/uL   RBC 3.81 (L) 3.87 - 5.11 MIL/uL   Hemoglobin 10.7 (L) 12.0 - 15.0 g/dL   HCT 32.5 (L) 36.0 - 46.0 %   MCV 85.3 78.0 - 100.0 fL   MCH 28.1 26.0 - 34.0 pg   MCHC 32.9 30.0 - 36.0 g/dL   RDW 13.6 11.5 - 15.5 %   Platelets 218 150 - 400 K/uL   Neutrophils Relative % 83 (H) 43 - 77 %   Neutro Abs 12.7 (H) 1.7 - 7.7 K/uL   Lymphocytes Relative 9 (L) 12 - 46 %   Lymphs Abs 1.3 0.7 - 4.0 K/uL   Monocytes Relative 7 3 - 12 %   Monocytes Absolute 1.0 0.1 - 1.0 K/uL   Eosinophils Relative 1 0 - 5 %   Eosinophils Absolute 0.1 0.0 - 0.7 K/uL   Basophils Relative 0 0 - 1 %   Basophils Absolute 0.0 0.0 - 0.1 K/uL  Comprehensive metabolic panel     Status: Abnormal   Collection Time: 10/28/14  9:32 PM  Result Value Ref Range   Sodium 136 135 - 145 mmol/L   Potassium 3.5 3.5 - 5.1 mmol/L   Chloride 108 101 - 111 mmol/L   CO2 21 (L) 22 - 32 mmol/L   Glucose,  Bld 123 (H) 65 - 99 mg/dL   BUN <5 (L) 6 - 20 mg/dL   Creatinine, Ser 0.49 0.44 - 1.00 mg/dL   Calcium 8.7 (L) 8.9 - 10.3 mg/dL   Total Protein 6.6 6.5 - 8.1 g/dL   Albumin 2.5 (L) 3.5 - 5.0 g/dL   AST 24 15 - 41 U/L   ALT 18 14 - 54 U/L   Alkaline Phosphatase 191 (H) 38 - 126 U/L   Total Bilirubin 0.5 0.3 - 1.2 mg/dL   GFR calc non Af Amer >60 >60 mL/min   GFR calc Af Amer >60 >60 mL/min    Comment: (NOTE) The eGFR has been calculated using the CKD EPI equation. This calculation has not been validated in all clinical situations. eGFR's persistently <60 mL/min signify possible Chronic Kidney Disease.    Anion gap 7 5 - 15   No results found.  Review of Systems  Constitutional: Positive for chills, malaise/fatigue and diaphoresis. Negative for fever and weight loss.  HENT: Negative  for hearing loss.   Eyes: Negative for blurred vision and double vision.  Respiratory: Negative for cough and hemoptysis.   Cardiovascular: Negative for chest pain and palpitations.  Gastrointestinal: Negative for heartburn, nausea, vomiting and abdominal pain.  Musculoskeletal: Positive for back pain. Negative for neck pain.  Neurological: Negative for dizziness, tingling and headaches.    Blood pressure 94/54, pulse 93, temperature 98 F (36.7 C), temperature source Oral, resp. rate 16, last menstrual period 03/07/2014, SpO2 99 %. Physical Exam  Constitutional: She is oriented to person, place, and time. She appears well-developed and well-nourished.  HENT:  Head: Normocephalic and atraumatic.  Eyes: EOM are normal. Pupils are equal, round, and reactive to light.  Neck: Normal range of motion. Neck supple.  Cardiovascular: Normal rate and regular rhythm.   Respiratory: Effort normal and breath sounds normal.  GI: Soft. She exhibits no distension. There is no tenderness. There is no rebound and no guarding.  gravid  Musculoskeletal: Normal range of motion.  Neurological: She is alert and  oriented to person, place, and time.  Skin: Skin is warm.   Anorectal: induration and firmness in the left buttock within 2cm of anal verge. Exam limited by pain.  Assessment/Plan 34yo female 32wga with perirectal abscess. Confirmed by exam and leukocytosis. She is extremely uncomfortable and not able to participate in local I+D therefore: -admit to hospital overnight with IV abx, pain control, NPO, IVF -NSTs per shift: OB aware of situation -plan for EUA in am with drainage of abscess  Arta Bruce Herbert Aguinaldo 10/29/2014, 2:11 AM

## 2014-10-29 NOTE — Anesthesia Procedure Notes (Signed)
Spinal Patient location during procedure: OR Start time: 10/29/2014 9:32 AM End time: 10/29/2014 9:36 AM Staffing Anesthesiologist: Suella Broad D Performed by: anesthesiologist  Preanesthetic Checklist Completed: patient identified, site marked, surgical consent, pre-op evaluation, timeout performed, IV checked, risks and benefits discussed and monitors and equipment checked Spinal Block Patient position: sitting Prep: Betadine Patient monitoring: heart rate, continuous pulse ox, blood pressure and cardiac monitor Approach: midline Location: L4-5 Injection technique: single-shot Needle Needle type: Whitacre and Introducer  Needle gauge: 24 G Needle length: 9 cm Additional Notes Negative paresthesia. Negative blood return. Positive free-flowing CSF. Expiration date of kit checked and confirmed. Patient tolerated procedure well, without complications.

## 2014-10-29 NOTE — ED Notes (Signed)
General Surgeon at bedside.

## 2014-10-30 ENCOUNTER — Encounter (HOSPITAL_COMMUNITY): Payer: Self-pay | Admitting: Surgery

## 2014-10-30 ENCOUNTER — Inpatient Hospital Stay (HOSPITAL_COMMUNITY): Payer: 59

## 2014-10-30 LAB — CBC
HEMATOCRIT: 28.7 % — AB (ref 36.0–46.0)
HEMOGLOBIN: 9.5 g/dL — AB (ref 12.0–15.0)
MCH: 28.4 pg (ref 26.0–34.0)
MCHC: 33.1 g/dL (ref 30.0–36.0)
MCV: 85.9 fL (ref 78.0–100.0)
Platelets: 193 10*3/uL (ref 150–400)
RBC: 3.34 MIL/uL — ABNORMAL LOW (ref 3.87–5.11)
RDW: 13.7 % (ref 11.5–15.5)
WBC: 9.8 10*3/uL (ref 4.0–10.5)

## 2014-10-30 MED ORDER — DOCUSATE SODIUM 100 MG PO CAPS
100.0000 mg | ORAL_CAPSULE | Freq: Two times a day (BID) | ORAL | Status: DC
  Administered 2014-10-30 – 2014-11-01 (×5): 100 mg via ORAL
  Filled 2014-10-30 (×5): qty 1

## 2014-10-30 MED ORDER — POLYETHYLENE GLYCOL 3350 17 G PO PACK: 17.0000 g | PACK | Freq: Every day | ORAL | Status: DC | PRN

## 2014-10-30 NOTE — Progress Notes (Signed)
Initial Nutrition Assessment  DOCUMENTATION CODES:   Not applicable  INTERVENTION:   -Snacks TID between meals  NUTRITION DIAGNOSIS:   Inadequate oral intake related to poor appetite as evidenced by per patient/family report.  GOAL:   Patient will meet greater than or equal to 90% of their needs  MONITOR:   PO intake, Diet advancement, Labs, Weight trends, Skin, I & O's  REASON FOR ASSESSMENT:   Malnutrition Screening Tool    ASSESSMENT:   34 yo female 51 wga presents with 2 weeks of worsening buttock pain. Pain is constant and much worse over the last 3 days. She has had BM yesterday. She is not eating well because of the pain. She denies fevers. Over the past 24h she has been to multiple ER/hospitals/consultations to arrive at [facility] ER.  Pt admitted for perirectal abscess. She is s/p I&D on 10/29/14.   Spoke with RN, who reveals that pt has had poor appetite for the past 1-2 weeks due to pain. She reveals that she ate a good breakfast this morning, after much encouragement about importance of good meal intake for fetal development. Per RN, pt was afraid of having a BM due to pain.   Pt reports decline in appetite over the past 2-3 weeks, due to pain. She has been consuming 50-75% of meals while in hospital. Pt reports she doesn't feel well and just doesn't feel like eating. She reports she just ate some ice cream; noted 50% of ice cream cup completed. Pt is agreeable to snacks to supplement intake; enjoys fruit, yogurt, and ice cream- RD to order.   Reinforced importance of good meal intake to promote healing.   Both pt and RN report no issues with pregnancy.   Prepregnancy wt: 156#. Pre-pregnancy BMI: 25.8. Pt has gained approximately 22# during her pregnancy, which is appropriate with pregnancy wt gain recommendations based on pre-pregnancy BMI.   Labs reviewed.  Diet Order:  Diet regular Room service appropriate?: Yes; Fluid consistency:: Thin  Skin:  Wound (see  comment) (closed rectum incision)  Last BM:  10/28/14  Height:   Ht Readings from Last 1 Encounters:  10/29/14 5\' 5"  (1.651 m)    Weight:   Wt Readings from Last 1 Encounters:  10/29/14 178 lb 12.7 oz (81.1 kg)    Ideal Body Weight:  n/a  BMI:  Body mass index is 29.75 kg/(m^2).  Estimated Nutritional Needs:   Kcal:  2100-2300  Protein:  85-100 grams  Fluid:  >2.5 L  EDUCATION NEEDS:   Education needs addressed  Benedetto Ryder A. Jimmye Norman, RD, LDN, CDE Pager: [phone] After hours Pager: [phone]

## 2014-10-30 NOTE — Progress Notes (Signed)
[facility] Progress Note  1 Day Post-Op  Subjective: Pt says the pain is improved, but tolerating dressing change was challenging.  No N/V, tolerating diet.  Worried about having a BM.  Wants stool softener.    Objective: Vital signs in last 24 hours: Temp:  [98 F (36.7 C)-99.2 F (37.3 C)] 98 F (36.7 C) (08/31 0442) Pulse Rate:  [63-90] 87 (08/31 0442) Resp:  [13-22] 20 (08/31 0442) BP: (83-108)/(52-73) 91/53 mmHg (08/31 0442) SpO2:  [99 %-100 %] 99 % (08/31 0442) Last BM Date: 10/28/14  Intake/Output from previous day: 08/30 0701 - 08/31 0700 In: 3740 [P.O.:880; I.V.:2860] Out: 2490 [Urine:2450; Blood:40] Intake/Output this shift:    PE: Gen:  Alert, NAD, pleasant Peri-rectum:  3cm incision, packing removed and replaced.  Peri-wound is not erythematous and no fluctuance.  Foul smell from wound.  Purulent sanguinous drainage on gauze.     Lab Results:   Recent Labs  10/28/14 2132  WBC 15.1*  HGB 10.7*  HCT 32.5*  PLT 218   BMET  Recent Labs  10/28/14 2132  NA 136  K 3.5  CL 108  CO2 21*  GLUCOSE 123*  BUN <5*  CREATININE 0.49  CALCIUM 8.7*   PT/INR No results for input(s): LABPROT, INR in the last 72 hours. CMP     Component Value Date/Time   NA 136 10/28/2014 2132   K 3.5 10/28/2014 2132   CL 108 10/28/2014 2132   CO2 21* 10/28/2014 2132   GLUCOSE 123* 10/28/2014 2132   BUN <5* 10/28/2014 2132   CREATININE 0.49 10/28/2014 2132   CALCIUM 8.7* 10/28/2014 2132   PROT 6.6 10/28/2014 2132   ALBUMIN 2.5* 10/28/2014 2132   AST 24 10/28/2014 2132   ALT 18 10/28/2014 2132   ALKPHOS 191* 10/28/2014 2132   BILITOT 0.5 10/28/2014 2132   GFRNONAA >60 10/28/2014 2132   GFRAA >60 10/28/2014 2132   Lipase     Component Value Date/Time   LIPASE 52 12/06/2008 2305       Studies/Results: No results found.  Anti-infectives: Anti-infectives    Start     Dose/Rate Route Frequency Ordered Stop   10/29/14 0500  ceFAZolin (ANCEF)  IVPB 2 g/50 mL premix     2 g 100 mL/hr over 30 Minutes Intravenous 3 times per day 10/29/14 0446         Assessment/Plan Perirectal abscess POD #1 s/p I&D -Dressing changes WD BID -Pain control -IV antibiotics (Ancef Day #2) -D/c foley -Repeat CBC  33 WGA -NST per shift, OB aware of situation   Disp - will need pain better controlled, will need [place] services (ordered)    LOS: 1 day    Nat Christen 10/30/2014, 7:30 AM Pager: [phone]

## 2014-10-30 NOTE — Care Management Note (Signed)
Case Management Note  Patient Details  Name: Kim [name] MRN: [id number] Date of Birth: [birth year]  Subjective/Objective:                    Action/Plan: Discussed home health with patient . Explained home health nurse will teach support person how to change dressing ( nurse will not do every dressing change) . Patient voiced understanding and stated her husband will be willing to be shown dressing changes.   Patient's cell phone number [phone] , patient's husband's cell phone number [phone]   Patient's address on face sheet is correct .  Expected Discharge Date:                  Expected Discharge Plan:  [place]  In-House Referral:     Discharge planning Services  CM Consult  Post Acute Care Choice:  Home Health Choice offered to:  Patient  DME Arranged:    DME Agency:  [place] Arranged:  RN [facility] Agency:  [facility]  Status of Service:  Completed, signed off  Medicare Important Message Given:    Date Medicare IM Given:    Medicare IM give by:    Date Additional Medicare IM Given:    Additional Medicare Important Message give by:     If discussed at [place] of Stay Meetings, dates discussed:    Additional Comments:  [name], RN 10/30/2014, 11:09 AM

## 2014-10-31 NOTE — Progress Notes (Signed)
[facility] Progress Note  2 Days Post-Op  Subjective: Pt says she's still has trouble urinating.  Had trouble breathing yesterday after given oral and IV pain meds, but that soon resolved with oxygen.  NST yesterday with OB nurse went well.  Patient anxious about having a BM.  Getting in the shower.  Eating well.  Flatus, but no BM yet.    Objective: Vital signs in last 24 hours: Temp:  [97.7 F (36.5 C)-98.8 F (37.1 C)] 98.1 F (36.7 C) (09/01 0444) Pulse Rate:  [73-88] 73 (09/01 0444) Resp:  [18-19] 19 (09/01 0444) BP: (91-96)/(48-65) 91/48 mmHg (09/01 0444) SpO2:  [10 %-100 %] 100 % (09/01 0444) Last BM Date: 10/28/14  Intake/Output from previous day: 08/31 0701 - 09/01 0700 In: 3265.7 [P.O.:1080; I.V.:2185.7] Out: 600 [Urine:600] Intake/Output this shift:    PE: Gen: Alert, NAD, very anxious Peri-rectum: 3cm incision, packing removed, pending shower. Peri-wound is not erythematous and no fluctuance. No foul smell from wound this am. Serosanguinous drainage from wound today.   Lab Results:   Recent Labs  10/28/14 2132 10/30/14 1004  WBC 15.1* 9.8  HGB 10.7* 9.5*  HCT 32.5* 28.7*  PLT 218 193   BMET  Recent Labs  10/28/14 2132  NA 136  K 3.5  CL 108  CO2 21*  GLUCOSE 123*  BUN <5*  CREATININE 0.49  CALCIUM 8.7*   PT/INR No results for input(s): LABPROT, INR in the last 72 hours. CMP     Component Value Date/Time   NA 136 10/28/2014 2132   K 3.5 10/28/2014 2132   CL 108 10/28/2014 2132   CO2 21* 10/28/2014 2132   GLUCOSE 123* 10/28/2014 2132   BUN <5* 10/28/2014 2132   CREATININE 0.49 10/28/2014 2132   CALCIUM 8.7* 10/28/2014 2132   PROT 6.6 10/28/2014 2132   ALBUMIN 2.5* 10/28/2014 2132   AST 24 10/28/2014 2132   ALT 18 10/28/2014 2132   ALKPHOS 191* 10/28/2014 2132   BILITOT 0.5 10/28/2014 2132   GFRNONAA >60 10/28/2014 2132   GFRAA >60 10/28/2014 2132   Lipase     Component Value Date/Time   LIPASE 52 12/06/2008  2305       Studies/Results: No results found.  Anti-infectives: Anti-infectives    Start     Dose/Rate Route Frequency Ordered Stop   10/29/14 0500  ceFAZolin (ANCEF) IVPB 2 g/50 mL premix     2 g 100 mL/hr over 30 Minutes Intravenous 3 times per day 10/29/14 0446         Assessment/Plan Perirectal abscess POD #2 s/p I&D -Dressing changes WD BID -Pain control is still an issue -IV antibiotics (Ancef Day #3), IVF as pressures have been soft -Repeat CBC normal -HH arranged (she knows they can't come out everyday), patient states husband is not willing to help with dressing change  33 WGA -NST daily per OB nurse and attending -OB aware of situation  Urinary retension -In & out cath as needed  Disp - will need pain better controlled, maybe d/c later today or tomorrow    LOS: 2 days    Nat Christen 10/31/2014, 7:59 AM Pager: [phone]

## 2014-10-31 NOTE — Progress Notes (Signed)
Did teaching of dressing change with husband

## 2014-11-01 DIAGNOSIS — R338 Other retention of urine: Secondary | ICD-10-CM | POA: Diagnosis present

## 2014-11-01 DIAGNOSIS — K5900 Constipation, unspecified: Secondary | ICD-10-CM | POA: Diagnosis present

## 2014-11-01 LAB — CULTURE, ROUTINE-ABSCESS

## 2014-11-01 MED ORDER — HYDROCODONE-ACETAMINOPHEN 5-325 MG PO TABS: 1.0000 | ORAL_TABLET | ORAL | Status: DC | PRN

## 2014-11-01 MED ORDER — AMOXICILLIN-POT CLAVULANATE 875-125 MG PO TABS: 1.0000 | ORAL_TABLET | Freq: Two times a day (BID) | ORAL | Status: DC

## 2014-11-01 MED ORDER — DOCUSATE SODIUM 100 MG PO CAPS: 100.0000 mg | ORAL_CAPSULE | Freq: Two times a day (BID) | ORAL | Status: DC

## 2014-11-01 MED ORDER — POLYETHYLENE GLYCOL 3350 17 G PO PACK: 17.0000 g | PACK | Freq: Every day | ORAL | Status: DC | PRN

## 2014-11-01 MED ORDER — AMOXICILLIN-POT CLAVULANATE 875-125 MG PO TABS
1.0000 | ORAL_TABLET | Freq: Two times a day (BID) | ORAL | Status: DC
  Administered 2014-11-01: 1 via ORAL
  Filled 2014-11-01: qty 1

## 2014-11-01 NOTE — Discharge Instructions (Signed)
Dressing Change °A dressing is a material placed over wounds. It keeps the wound clean, dry, and protected from further injury. This provides an environment that favors wound healing.  °BEFORE YOU BEGIN °· Get your supplies together. Things you may need include: °¨ Saline solution. °¨ Flexible gauze dressing. °¨ Medicated cream. °¨ Tape. °¨ Gloves. °¨ Abdominal dressing pads. °¨ Gauze squares. °¨ Plastic bags. °· Take pain medicine 30 minutes before the dressing change if you need it. °· Take a shower before you do the first dressing change of the day. Use plastic wrap or a plastic bag to prevent the dressing from getting wet. °REMOVING YOUR OLD DRESSING  °· Wash your hands with soap and water. Dry your hands with a clean towel. °· Put on your gloves. °· Remove any tape. °· Carefully remove the old dressing. If the dressing sticks, you may dampen it with warm water to loosen it, or follow your caregiver's specific directions. °· Remove any gauze or packing tape that is in your wound. °· Take off your gloves. °· Put the gloves, tape, gauze, or any packing tape into a plastic bag. °CHANGING YOUR DRESSING °· Open the supplies. °· Take the cap off the saline solution. °· Open the gauze package so that the gauze remains on the inside of the package. °· Put on your gloves. °· Clean your wound as told by your caregiver. °· If you have been told to keep your wound dry, follow those instructions. °· Your caregiver may tell you to do one or more of the following: °¨ Pick up the gauze. Pour the saline solution over the gauze. Squeeze out the extra saline solution. °¨ Put medicated cream or other medicine on your wound if you have been told to do so. °¨ Put the solution soaked gauze only in your wound, not on the skin around it. °¨ Pack your wound loosely or as told by your caregiver. °¨ Put dry gauze on your wound. °¨ Put abdominal dressing pads over the dry gauze if your wet gauze soaks through. °· Tape the abdominal dressing  pads in place so they will not fall off. Do not wrap the tape completely around the affected part (arm, leg, abdomen). °· Wrap the dressing pads with a flexible gauze dressing to secure it in place. °· Take off your gloves. Put them in the plastic bag with the old dressing. Tie the bag shut and throw it away. °· Keep the dressing clean and dry until your next dressing change. °· Wash your hands. °SEEK MEDICAL CARE IF: °· Your skin around the wound looks red. °· Your wound feels more tender or sore. °· You see pus in the wound. °· Your wound smells bad. °· You have a fever. °· Your skin around the wound has a rash that itches and burns. °· You see black or yellow skin in your wound that was not there before. °· You feel nauseous, throw up, and feel very tired. °Document Released: 03/25/2004 Document Revised: 05/10/2011 Document Reviewed: 12/28/2010 °ExitCare® Patient Information ©2015 [employer]. This information is not intended to replace advice given to you by your health care provider. Make sure you discuss any questions you have with your health care provider. ° °

## 2014-11-01 NOTE — Discharge Summary (Signed)
[facility] Discharge Summary   Patient ID: Kim [name] MRN: [id number] DOB/AGE: 33-Nov-1982 3 y.o.  Admit date: 10/29/2014 Discharge date: 11/01/2014  Admitting Diagnosis: Abscess of anal/rectal regions Supervision of normal pregnancy Urinary retention  Constipation  Discharge Diagnosis Patient Active Problem List   Diagnosis Date Noted  . Acute urinary retention 11/01/2014  . Constipation 11/01/2014  . Abscess of anal and rectal regions 10/29/2014  . Fibroid uterus 08/19/2014  . Supervision of other normal pregnancy, antepartum 05/27/2014  . Cephalalgia 02/21/2013  . GALLSTONES 12/31/2008    Consultants OB Nursing  Imaging: No results found.  Procedures Dr. Ninfa Linden (10/29/14) - Incision and drainage of perirectal abscess  Hospital Course:  34 yo female 104 wga presents with 2 weeks of worsening buttock pain. Pain is constant and much worse over the last 3 days. She has had BM yesterday. She is not eating well because of the pain. She denies fevers. Over the past 24h she has been to multiple ER/hospitals/consultations to arrive at [name] ER.  Workup showed perirectal abscess, urinary retention, constipation, and leukocytosis.  Her OB was notified of her admission and we had the OB nurse doing qshift NST.  Patient was admitted and underwent procedure listed above.  Tolerated procedure well and was transferred to the floor.  Diet was advanced as tolerated.  Pain control was difficult and the main reason for continued admission.  HH was arranged for nursing care and her husband was taught how to change her dressing.  On POD #3, the patient was voiding well, tolerating diet, ambulating well, pain well controlled, vital signs stable, wound clean and felt stable for discharge home.  Patient will follow up in our office in 2-3 weeks and knows to call with questions or concerns.  She will need her dressing changed daily (WD packing changes with kerlix gauze).  Will send home  with 7 days of augmentin Category B pregnancy list.  She had had nearly 4 days of Ancef.  Her cultures show gram positive cocci in clusters and abundant gram neg rods.  It also shows abundant streptococcus species, but cultures were re-incubated for better growth.  Augmentin should cover both.    Physical Exam: General:  Alert, NAD, pleasant, comfortable Peri-rectum: 3cm incision. Peri-wound is not erythematous and no fluctuance. No foul smell from wound this am. Serosanguinous drainage from wound today.    Medication List    STOP taking these medications        cephALEXin 500 MG capsule  Commonly known as:  KEFLEX     hydrocortisone 2.5 % rectal cream  Commonly known as:  ANUSOL-HC     hydrocortisone-pramoxine rectal foam  Commonly known as:  PROCTOFOAM HC     lidocaine 2 % jelly  Commonly known as:  XYLOCAINE     oxyCODONE-acetaminophen 5-325 MG per tablet  Commonly known as:  PERCOCET/ROXICET     promethazine 25 MG tablet  Commonly known as:  PHENERGAN      TAKE these medications        amoxicillin-clavulanate 875-125 MG per tablet  Commonly known as:  AUGMENTIN  Take 1 tablet by mouth every 12 (twelve) hours.     CONCEPT DHA 53.5-38-1 MG Caps  Take 1 capsule by mouth daily.     docusate sodium 100 MG capsule  Commonly known as:  COLACE  Take 1 capsule (100 mg total) by mouth 2 (two) times daily.     HYDROcodone-acetaminophen 5-325 MG per tablet  Commonly known as:  NORCO/VICODIN  Take 1-2 tablets by mouth every 4 (four) hours as needed for moderate pain or severe pain.     polyethylene glycol packet  Commonly known as:  MIRALAX / GLYCOLAX  Take 17 g by mouth daily as needed for mild constipation or moderate constipation.         Follow-up Information    Follow up with [facility], MD. Go in 2 weeks.   Specialty:  General Surgery   Why:  For post-operation check.  Call to confirm appointment date/time.   Contact information:   [address] [place] [address] [phone]       Follow up with LEGGETT,KELLY H., MD. Schedule an appointment as soon as possible for a visit in 1 week.   Specialty:  Obstetrics and Gynecology   Why:  For post-hospital follow up   Contact information:   [place] [place] [address] [phone]       Signed: Nat Christen, [facility] [phone]  11/01/2014, 8:03 AM

## 2014-11-05 LAB — ANAEROBIC CULTURE

## 2014-11-06 ENCOUNTER — Ambulatory Visit (INDEPENDENT_AMBULATORY_CARE_PROVIDER_SITE_OTHER): Payer: 59 | Admitting: Obstetrics & Gynecology

## 2014-11-06 VITALS — BP 97/67 | HR 94 | Temp 97.7°F | Wt 176.0 lb

## 2014-11-06 DIAGNOSIS — Z3483 Encounter for supervision of other normal pregnancy, third trimester: Secondary | ICD-10-CM

## 2014-11-06 NOTE — Progress Notes (Signed)
Subjective:  Kim [name] is a 34 y.o. G3P2002 at [redacted]w[redacted]d being seen today for ongoing prenatal care.  Patient reports no complaints. She had I&D of a rectal abscess. She is recovering slowly. A nurse comes 2-3 times per week and her husband changes the dressing the other days.   Contractions: Not present.  Vag. Bleeding: None. Movement: Present. Denies leaking of fluid.   The following portions of the patient's history were reviewed and updated as appropriate: allergies, current medications, past family history, past medical history, past social history, past surgical history and problem list.   Objective:   Filed Vitals:   11/06/14 1058  BP: 97/67  Pulse: 94  Temp: 97.7 F (36.5 C)  Weight: 176 lb (79.833 kg)    Fetal Status: Fetal Heart Rate (bpm): 152   Movement: Present     General:  Alert, oriented and cooperative. Patient is in no acute distress.  Skin: Skin is warm and dry. No rash noted.   Cardiovascular: Normal heart rate noted  Respiratory: Normal respiratory effort, no problems with respiration noted  Abdomen: Soft, gravid, appropriate for gestational age. Pain/Pressure: Present     Pelvic: Vag. Bleeding: None Vag D/C Character: Thin   Cervical exam deferred        Extremities: Normal range of motion.  Edema: Trace  Mental Status: Normal mood and affect. Normal behavior. Normal judgment and thought content.   Urinalysis: Urine Protein: Negative Urine Glucose: Negative  Assessment and Plan:  Pregnancy: G3P2002 at 109w6d - Cervical cultures at next visit There are no diagnoses linked to this encounter. Preterm labor symptoms and general obstetric precautions including but not limited to vaginal bleeding, contractions, leaking of fluid and fetal movement were reviewed in detail with the patient. Please refer to After Visit Summary for other counseling recommendations.  No Follow-up on file.   Emily Filbert, MD

## 2014-11-20 ENCOUNTER — Ambulatory Visit (INDEPENDENT_AMBULATORY_CARE_PROVIDER_SITE_OTHER): Payer: 59 | Admitting: Obstetrics & Gynecology

## 2014-11-20 VITALS — BP 102/62 | HR 91 | Wt 178.0 lb

## 2014-11-20 DIAGNOSIS — Z36 Encounter for antenatal screening of mother: Secondary | ICD-10-CM

## 2014-11-20 DIAGNOSIS — Z3483 Encounter for supervision of other normal pregnancy, third trimester: Secondary | ICD-10-CM

## 2014-11-20 DIAGNOSIS — Z3493 Encounter for supervision of normal pregnancy, unspecified, third trimester: Secondary | ICD-10-CM

## 2014-11-20 LAB — OB RESULTS CONSOLE GC/CHLAMYDIA
CHLAMYDIA, DNA PROBE: NEGATIVE
Gonorrhea: NEGATIVE

## 2014-11-20 LAB — OB RESULTS CONSOLE GBS: GBS: NEGATIVE

## 2014-11-20 NOTE — Progress Notes (Signed)
Subjective:  Lasha Echeverria is a 34 y.o. G3P2002 at [redacted]w[redacted]d being seen today for ongoing prenatal care.  Patient reports no complaints. Her rectal pain is improving. Her husband is changing the dressing twice per day.  Contractions: Irritability.  Vag. Bleeding: None. Movement: Present. Denies leaking of fluid.   The following portions of the patient's history were reviewed and updated as appropriate: allergies, current medications, past family history, past medical history, past social history, past surgical history and problem list.   Objective:   Filed Vitals:   11/20/14 1043  BP: 102/62  Pulse: 91  Weight: 178 lb (80.74 kg)    Fetal Status: Fetal Heart Rate (bpm): 153 Fundal Height: 35 cm Movement: Present     General:  Alert, oriented and cooperative. Patient is in no acute distress.  Skin: Skin is warm and dry. No rash noted.   Cardiovascular: Normal heart rate noted  Respiratory: Normal respiratory effort, no problems with respiration noted  Abdomen: Soft, gravid, appropriate for gestational age. Pain/Pressure: Present     Pelvic: Vag. Bleeding: None Vag D/C Character: Thin   Cervical exam deferred        Extremities: Normal range of motion.  Edema: Trace  Mental Status: Normal mood and affect. Normal behavior. Normal judgment and thought content.   Urinalysis: Urine Protein: Negative Urine Glucose: Negative  Assessment and Plan:  Pregnancy: G3P2002 at [redacted]w[redacted]d  1. Supervision of normal pregnancy, third trimester  - GC/Chlamydia Probe Amp - Culture, beta strep (group b only)  2. Supervision of other normal pregnancy, antepartum, third trimester   Term labor symptoms and general obstetric precautions including but not limited to vaginal bleeding, contractions, leaking of fluid and fetal movement were reviewed in detail with the patient. Please refer to After Visit Summary for other counseling recommendations.  Return in about 1 week (around 11/27/2014).   Emily Filbert, MD

## 2014-11-20 NOTE — Progress Notes (Signed)
GBS and GC/Chlamydia cultures obtained

## 2014-11-21 LAB — GC/CHLAMYDIA PROBE AMP
CT PROBE, AMP APTIMA: NEGATIVE
GC Probe RNA: NEGATIVE

## 2014-11-22 LAB — CULTURE, BETA STREP (GROUP B ONLY)

## 2014-11-27 ENCOUNTER — Ambulatory Visit (INDEPENDENT_AMBULATORY_CARE_PROVIDER_SITE_OTHER): Payer: 59 | Admitting: Obstetrics & Gynecology

## 2014-11-27 ENCOUNTER — Encounter: Payer: Self-pay | Admitting: Obstetrics & Gynecology

## 2014-11-27 VITALS — BP 112/71 | HR 99 | Temp 98.0°F | Wt 178.0 lb

## 2014-11-27 DIAGNOSIS — Z3483 Encounter for supervision of other normal pregnancy, third trimester: Secondary | ICD-10-CM

## 2014-11-27 DIAGNOSIS — K612 Anorectal abscess: Secondary | ICD-10-CM

## 2014-11-27 NOTE — Progress Notes (Signed)
Subjective:  Kim [name] is a 34 y.o. G3P2002 at [redacted]w[redacted]d being seen today for ongoing prenatal care.  Patient reports pain in her rectum after surgery.  Contractions: Irritability.  Vag. Bleeding: None. Movement: Present. Denies leaking of fluid.  She has had a decreased appetite since surgery but, was seen by the surgeon 2 days prev who reports that she is doing well,   The following portions of the patient's history were reviewed and updated as appropriate: allergies, current medications, past family history, past medical history, past social history, past surgical history and problem list.   Objective:   Filed Vitals:   11/27/14 0849  BP: 112/71  Pulse: 99  Temp: 98 F (36.7 C)  Weight: 178 lb (80.74 kg)    Fetal Status: Fetal Heart Rate (bpm): 155 Fundal Height: 35 cm Movement: Present     General:  Alert, oriented and cooperative. Patient is in no acute distress.  Skin: Skin is warm and dry. No rash noted.   Cardiovascular: Normal heart rate noted  Respiratory: Normal respiratory effort, no problems with respiration noted  Abdomen: Soft, gravid, appropriate for gestational age. Pain/Pressure: Present     Pelvic: Vag. Bleeding: None Vag D/C Character: Thin   Cervical exam deferred        Extremities: Normal range of motion.  Edema: None  Mental Status: Normal mood and affect. Normal behavior. Normal judgment and thought content.   Urinalysis: Urine Protein: Negative Urine Glucose: Negative  Assessment and Plan:  Pregnancy: G3P2002 at [redacted]w[redacted]d  1. Supervision of other normal pregnancy, antepartum, third trimester Little weight gain this month but has had surgery and sl decreased appetite.  Will follow. Pt feeling better daily.  2. Abscess of anal and rectal regions Improving.  Followed by gen surgery  Term labor symptoms and general obstetric precautions including but not limited to vaginal bleeding, contractions, leaking of fluid and fetal movement were reviewed in detail with  the patient. Please refer to After Visit Summary for other counseling recommendations.  Return in about 1 week (around 12/04/2014).   Lavonia Drafts, MD

## 2014-11-27 NOTE — Patient Instructions (Signed)

## 2014-12-04 ENCOUNTER — Ambulatory Visit (INDEPENDENT_AMBULATORY_CARE_PROVIDER_SITE_OTHER): Payer: 59 | Admitting: Obstetrics & Gynecology

## 2014-12-04 VITALS — BP 100/64 | HR 72 | Wt 180.0 lb

## 2014-12-04 DIAGNOSIS — K612 Anorectal abscess: Secondary | ICD-10-CM

## 2014-12-04 DIAGNOSIS — Z3483 Encounter for supervision of other normal pregnancy, third trimester: Secondary | ICD-10-CM

## 2014-12-04 NOTE — Progress Notes (Signed)
Subjective:  Kim [name] is a 34 y.o. M  G3P2002 at [redacted]w[redacted]d being seen today for ongoing prenatal care.  Patient reports no complaints.  Contractions: Irregular.  Vag. Bleeding: None. Movement: Present. Denies leaking of fluid.   The following portions of the patient's history were reviewed and updated as appropriate: allergies, current medications, past family history, past medical history, past social history, past surgical history and problem list.   Objective:   Filed Vitals:   12/04/14 0847  BP: 100/64  Pulse: 72  Weight: 180 lb (81.647 kg)    Fetal Status: Fetal Heart Rate (bpm): 143 Fundal Height: 35 cm Movement: Present     General:  Alert, oriented and cooperative. Patient is in no acute distress.  Skin: Skin is warm and dry. No rash noted.   Cardiovascular: Normal heart rate noted  Respiratory: Normal respiratory effort, no problems with respiration noted  Abdomen: Soft, gravid, appropriate for gestational age. Pain/Pressure: Present     Pelvic: Vag. Bleeding: None Vag D/C Character: Thin   Cervical exam performed        Extremities: Normal range of motion.  Edema: None  Mental Status: Normal mood and affect. Normal behavior. Normal judgment and thought content.   Urinalysis: Urine Protein: Negative Urine Glucose: Negative  Assessment and Plan:  Pregnancy: G3P2002 at [redacted]w[redacted]d  1. Abscess of anal and rectal regions - I examined the region and the abscess is completely healed   2. Supervision of other normal pregnancy, antepartum, third trimester   Term labor symptoms and general obstetric precautions including but not limited to vaginal bleeding, contractions, leaking of fluid and fetal movement were reviewed in detail with the patient. Please refer to After Visit Summary for other counseling recommendations.  Return in about 1 week (around 12/11/2014).   Emily Filbert, MD

## 2014-12-07 ENCOUNTER — Encounter (HOSPITAL_COMMUNITY): Payer: Self-pay | Admitting: *Deleted

## 2014-12-07 ENCOUNTER — Inpatient Hospital Stay (HOSPITAL_COMMUNITY)
Admission: AD | Admit: 2014-12-07 | Discharge: 2014-12-07 | Disposition: A | Discharge status: Home / Self Care | Payer: 59 | Source: Ambulatory Visit | Attending: Obstetrics and Gynecology | Admitting: Obstetrics and Gynecology

## 2014-12-07 NOTE — Discharge Instructions (Signed)

## 2014-12-07 NOTE — MAU Note (Signed)
Pt C/O uc's since last night, denies bleeding or LOF.

## 2014-12-08 ENCOUNTER — Inpatient Hospital Stay (HOSPITAL_COMMUNITY): Payer: 59 | Admitting: Anesthesiology

## 2014-12-08 ENCOUNTER — Inpatient Hospital Stay (HOSPITAL_COMMUNITY)
Admission: AD | Admit: 2014-12-08 | Discharge: 2014-12-10 | DRG: 775 | Disposition: A | Discharge status: Home / Self Care | Payer: 59 | Source: Ambulatory Visit | Attending: Obstetrics and Gynecology | Admitting: Obstetrics and Gynecology

## 2014-12-08 ENCOUNTER — Encounter (HOSPITAL_COMMUNITY): Payer: Self-pay | Admitting: *Deleted

## 2014-12-08 DIAGNOSIS — K8020 Calculus of gallbladder without cholecystitis without obstruction: Secondary | ICD-10-CM | POA: Diagnosis present

## 2014-12-08 DIAGNOSIS — R51 Headache: Secondary | ICD-10-CM | POA: Diagnosis present

## 2014-12-08 DIAGNOSIS — D259 Leiomyoma of uterus, unspecified: Secondary | ICD-10-CM | POA: Diagnosis present

## 2014-12-08 DIAGNOSIS — O2662 Liver and biliary tract disorders in childbirth: Secondary | ICD-10-CM | POA: Diagnosis present

## 2014-12-08 DIAGNOSIS — O3413 Maternal care for benign tumor of corpus uteri, third trimester: Secondary | ICD-10-CM | POA: Diagnosis present

## 2014-12-08 DIAGNOSIS — Z3A39 39 weeks gestation of pregnancy: Secondary | ICD-10-CM

## 2014-12-08 DIAGNOSIS — Z3A37 37 weeks gestation of pregnancy: Secondary | ICD-10-CM

## 2014-12-08 LAB — CBC
HCT: 34.1 % — ABNORMAL LOW (ref 36.0–46.0)
HEMOGLOBIN: 10.9 g/dL — AB (ref 12.0–15.0)
MCH: 26.6 pg (ref 26.0–34.0)
MCHC: 32 g/dL (ref 30.0–36.0)
MCV: 83.2 fL (ref 78.0–100.0)
PLATELETS: 228 10*3/uL (ref 150–400)
RBC: 4.1 MIL/uL (ref 3.87–5.11)
RDW: 15 % (ref 11.5–15.5)
WBC: 10.8 10*3/uL — AB (ref 4.0–10.5)

## 2014-12-08 LAB — TYPE AND SCREEN
ABO/RH(D): A POS
Antibody Screen: NEGATIVE

## 2014-12-08 LAB — RPR: RPR: NONREACTIVE

## 2014-12-08 LAB — ABO/RH: ABO/RH(D): A POS

## 2014-12-08 MED ORDER — PHENYLEPHRINE 40 MCG/ML (10ML) SYRINGE FOR IV PUSH (FOR BLOOD PRESSURE SUPPORT)
80.0000 ug | PREFILLED_SYRINGE | INTRAVENOUS | Status: DC | PRN
  Filled 2014-12-08: qty 20

## 2014-12-08 MED ORDER — FENTANYL CITRATE (PF) 100 MCG/2ML IJ SOLN: 100.0000 ug | Freq: Once | INTRAMUSCULAR | Status: DC

## 2014-12-08 MED ORDER — ONDANSETRON HCL 4 MG/2ML IJ SOLN: 4.0000 mg | INTRAMUSCULAR | Status: DC | PRN

## 2014-12-08 MED ORDER — OXYCODONE-ACETAMINOPHEN 5-325 MG PO TABS: 2.0000 | ORAL_TABLET | ORAL | Status: DC | PRN

## 2014-12-08 MED ORDER — LIDOCAINE HCL (PF) 1 % IJ SOLN
INTRAMUSCULAR | Status: DC | PRN
  Administered 2014-12-08 (×2): 4 mL

## 2014-12-08 MED ORDER — OXYCODONE-ACETAMINOPHEN 5-325 MG PO TABS: 1.0000 | ORAL_TABLET | ORAL | Status: DC | PRN

## 2014-12-08 MED ORDER — CITRIC ACID-SODIUM CITRATE 334-500 MG/5ML PO SOLN
30.0000 mL | ORAL | Status: DC | PRN
Start: 2014-12-08 — End: 2014-12-08

## 2014-12-08 MED ORDER — ONDANSETRON HCL 4 MG/2ML IJ SOLN: 4.0000 mg | Freq: Four times a day (QID) | INTRAMUSCULAR | Status: DC | PRN

## 2014-12-08 MED ORDER — OXYTOCIN 40 UNITS IN LACTATED RINGERS INFUSION - SIMPLE MED
62.5000 mL/h | INTRAVENOUS | Status: DC
  Filled 2014-12-08: qty 1000

## 2014-12-08 MED ORDER — DIBUCAINE 1 % RE OINT: 1.0000 "application " | TOPICAL_OINTMENT | RECTAL | Status: DC | PRN

## 2014-12-08 MED ORDER — SIMETHICONE 80 MG PO CHEW: 80.0000 mg | CHEWABLE_TABLET | ORAL | Status: DC | PRN

## 2014-12-08 MED ORDER — LACTATED RINGERS IV SOLN
500.0000 mL | INTRAVENOUS | Status: DC | PRN
  Administered 2014-12-08: 1000 mL via INTRAVENOUS

## 2014-12-08 MED ORDER — DIPHENHYDRAMINE HCL 50 MG/ML IJ SOLN: 12.5000 mg | INTRAMUSCULAR | Status: DC | PRN

## 2014-12-08 MED ORDER — ACETAMINOPHEN 325 MG PO TABS: 650.0000 mg | ORAL_TABLET | ORAL | Status: DC | PRN

## 2014-12-08 MED ORDER — ZOLPIDEM TARTRATE 5 MG PO TABS: 5.0000 mg | ORAL_TABLET | Freq: Every evening | ORAL | Status: DC | PRN

## 2014-12-08 MED ORDER — FENTANYL 2.5 MCG/ML BUPIVACAINE 1/10 % EPIDURAL INFUSION (WH - ANES)
14.0000 mL/h | INTRAMUSCULAR | Status: DC | PRN
Start: 2014-12-08 — End: 2014-12-08
  Administered 2014-12-08 (×2): 14 mL/h via EPIDURAL
  Filled 2014-12-08: qty 125

## 2014-12-08 MED ORDER — IBUPROFEN 600 MG PO TABS
600.0000 mg | ORAL_TABLET | Freq: Four times a day (QID) | ORAL | Status: DC
  Administered 2014-12-08 – 2014-12-10 (×9): 600 mg via ORAL
  Filled 2014-12-08 (×9): qty 1

## 2014-12-08 MED ORDER — LIDOCAINE HCL (PF) 1 % IJ SOLN
30.0000 mL | INTRAMUSCULAR | Status: AC | PRN
  Administered 2014-12-08: 30 mL via SUBCUTANEOUS
  Filled 2014-12-08: qty 30

## 2014-12-08 MED ORDER — TETANUS-DIPHTH-ACELL PERTUSSIS 5-2.5-18.5 LF-MCG/0.5 IM SUSP: 0.5000 mL | Freq: Once | INTRAMUSCULAR | Status: DC

## 2014-12-08 MED ORDER — BENZOCAINE-MENTHOL 20-0.5 % EX AERO: 1.0000 "application " | INHALATION_SPRAY | CUTANEOUS | Status: DC | PRN

## 2014-12-08 MED ORDER — SENNOSIDES-DOCUSATE SODIUM 8.6-50 MG PO TABS
2.0000 | ORAL_TABLET | ORAL | Status: DC
  Administered 2014-12-08 – 2014-12-09 (×2): 2 via ORAL
  Filled 2014-12-08 (×2): qty 2

## 2014-12-08 MED ORDER — EPHEDRINE 5 MG/ML INJ: 10.0000 mg | INTRAVENOUS | Status: DC | PRN

## 2014-12-08 MED ORDER — OXYTOCIN BOLUS FROM INFUSION
500.0000 mL | INTRAVENOUS | Status: DC
  Administered 2014-12-08: 500 mL via INTRAVENOUS

## 2014-12-08 MED ORDER — FLEET ENEMA 7-19 GM/118ML RE ENEM: 1.0000 | ENEMA | RECTAL | Status: DC | PRN

## 2014-12-08 MED ORDER — LACTATED RINGERS IV SOLN
INTRAVENOUS | Status: DC
  Administered 2014-12-08: 05:00:00 via INTRAVENOUS

## 2014-12-08 MED ORDER — WITCH HAZEL-GLYCERIN EX PADS: 1.0000 "application " | MEDICATED_PAD | CUTANEOUS | Status: DC | PRN

## 2014-12-08 MED ORDER — PRENATAL MULTIVITAMIN CH
1.0000 | ORAL_TABLET | Freq: Every day | ORAL | Status: DC
  Administered 2014-12-08 – 2014-12-10 (×3): 1 via ORAL
  Filled 2014-12-08 (×3): qty 1

## 2014-12-08 MED ORDER — ONDANSETRON HCL 4 MG PO TABS: 4.0000 mg | ORAL_TABLET | ORAL | Status: DC | PRN

## 2014-12-08 MED ORDER — DIPHENHYDRAMINE HCL 25 MG PO CAPS: 25.0000 mg | ORAL_CAPSULE | Freq: Four times a day (QID) | ORAL | Status: DC | PRN

## 2014-12-08 MED ORDER — LANOLIN HYDROUS EX OINT: TOPICAL_OINTMENT | CUTANEOUS | Status: DC | PRN

## 2014-12-08 NOTE — Progress Notes (Signed)
Pt up to [facility] after sve

## 2014-12-08 NOTE — H&P (Signed)
Kim [name] is a 34 y.o. female presenting for Labor. History is remarkable for recent I&D of perirectal abscess.  Maternal Medical History:  Reason for admission: Contractions.  Nausea.  Contractions: Onset was 1-2 hours ago.   Frequency: regular.   Perceived severity is strong.    Fetal activity: Perceived fetal activity is normal.   Last perceived fetal movement was within the past hour.    Prenatal complications: No bleeding, PIH, placental abnormality, pre-eclampsia or preterm labor.   Prenatal Complications - Diabetes: none.    OB History    Gravida Para Term Preterm AB TAB SAB Ectopic Multiple Living   3 2 2       2      Past Medical History  Diagnosis Date  . Hemorrhoids    Past Surgical History  Procedure Laterality Date  . Cholecystectomy    . Incision and drainage perirectal abscess N/A 10/29/2014    Procedure: IRRIGATION AND DEBRIDEMENT PERIRECTAL ABSCESS;  Surgeon: Coralie Keens, MD;  Location: [place];  Service: General;  Laterality: N/A;   Family History: family history includes Diabetes in her mother. Social History:  reports that she has never smoked. She has never used smokeless tobacco. She reports that she does not drink alcohol or use illicit drugs.   Prenatal Transfer Tool  Maternal Diabetes: No Genetic Screening: Normal Maternal Ultrasounds/Referrals: Normal Fetal Ultrasounds or other Referrals:  None Maternal Substance Abuse:  No Significant Maternal Medications:  None Significant Maternal Lab Results:  None  GBS Neg Other Comments:  None  Review of Systems  Constitutional: Negative for fever, chills and malaise/fatigue.  Gastrointestinal: Positive for abdominal pain. Negative for nausea, vomiting, diarrhea and constipation.  Genitourinary: Negative for dysuria.  Musculoskeletal: Negative for back pain.  Neurological: Negative for dizziness and headaches.    Dilation: 4.5 Effacement (%): 70 Station: -2 Exam by:: Leigh Aurora, RN Blood  pressure 107/69, pulse 62, temperature 97.7 F (36.5 C), temperature source Oral, resp. rate 16, height 5\' 5"  (1.651 m), weight 82.918 kg (182 lb 12.8 oz), last menstrual period 03/07/2014. Maternal Exam:  Uterine Assessment: Contraction strength is firm.  Contraction frequency is regular.   Abdomen: Patient reports no abdominal tenderness. Fundal height is 37.   Estimated fetal weight is 7.   Fetal presentation: vertex  Introitus: Normal vulva. Vagina is positive for vaginal discharge.  Ferning test: not done.  Nitrazine test: not done. Amniotic fluid character: not assessed.  Pelvis: adequate for delivery.   Cervix: Cervix evaluated by digital exam.     Fetal Exam Fetal Monitor Review: Mode: ultrasound.   Baseline rate: 135.  Variability: moderate (6-25 bpm).   Pattern: accelerations present, variable decelerations and early decelerations.    Fetal State Assessment: Category I - tracings are normal.     Physical Exam  Constitutional: She is oriented to person, place, and time. She appears well-developed and well-nourished. She appears distressed (with pain when she has contractions).  HENT:  Head: Normocephalic.  Cardiovascular: Normal rate and regular rhythm.   Respiratory: Effort normal and breath sounds normal. No respiratory distress.  GI: Soft. She exhibits no distension. There is no tenderness. There is no rebound and no guarding.  Genitourinary: Vaginal discharge found.  Cervix Dilation: 4.5 Effacement (%): 70 Cervical Position: Middle Station: -2 Presentation: Vertex Exam by:: Leigh Aurora, RN   Musculoskeletal: Normal range of motion.  Neurological: She is alert and oriented to person, place, and time.  Skin: Skin is warm and dry.  Psychiatric: She has a  normal mood and affect.    Prenatal labs: ABO, Rh: --/--/A POS (10/09 0400) Antibody: NEG (10/09 0400) Rubella: 3.89 (03/28 1439) RPR: NON REAC (07/18 0958)  HBsAg: NEGATIVE (03/28 1439)  HIV:  NONREACTIVE (07/18 2229)  GBS: Negative (09/21 0000)   Assessment/Plan: A;  SIUP at [redacted]w[redacted]d       Active Labor       Recent perirectal abscess       GBS Neg  P:  Admit to [facility]       Routine orders       Epidural for pain   [facility] 12/08/2014, 5:07 AM

## 2014-12-08 NOTE — Progress Notes (Signed)
Carmelia Roller CNM notified of pt's admission and status. Aware of ? SROM per pt but no fld noted currently, sve, neg. GBS, hx I&D rectal abscess end of SEpt. Will admit to BS.

## 2014-12-08 NOTE — Anesthesia Postprocedure Evaluation (Signed)
  Anesthesia Post-op Note  Patient: Kim [name]  Procedure(s) Performed: * No procedures listed *  Patient Location: Mother/Baby  Anesthesia Type:Epidural  Level of Consciousness: awake and alert   Airway and Oxygen Therapy: Patient Spontanous Breathing  Post-op Pain: mild  Post-op Assessment: Post-op Vital signs reviewed, Patient's Cardiovascular Status Stable, Respiratory Function Stable, No signs of Nausea or vomiting, Pain level controlled, No headache, Spinal receding and Patient able to bend at knees              Post-op Vital Signs: Reviewed  Last Vitals:  Filed Vitals:   12/08/14 1345  BP: 104/65  Pulse: 56  Temp: 36.9 C  Resp: 18    Complications: No apparent anesthesia complications

## 2014-12-08 NOTE — Anesthesia Procedure Notes (Signed)
Epidural Patient location during procedure: OB  Staffing Anesthesiologist: Judah Carchi Performed by: anesthesiologist   Preanesthetic Checklist Completed: patient identified, site marked, surgical consent, pre-op evaluation, timeout performed, IV checked, risks and benefits discussed and monitors and equipment checked  Epidural Patient position: sitting Prep: site prepped and draped and DuraPrep Patient monitoring: continuous pulse ox and blood pressure Approach: midline Location: L3-L4 Injection technique: LOR saline  Needle:  Needle type: Tuohy  Needle gauge: 17 G Needle length: 9 cm and 9 Needle insertion depth: 5 cm cm Catheter type: closed end flexible Catheter size: 19 Gauge Catheter at skin depth: 10 cm Test dose: negative  Assessment Events: blood not aspirated, injection not painful, no injection resistance, negative IV test and no paresthesia  Additional Notes Patient identified. Risks/Benefits/Options discussed with patient including but not limited to bleeding, infection, nerve damage, paralysis, failed block, incomplete pain control, headache, blood pressure changes, nausea, vomiting, reactions to medication both or allergic, itching and postpartum back pain. Confirmed with bedside nurse the patient's most recent platelet count. Confirmed with patient that they are not currently taking any anticoagulation, have any bleeding history or any family history of bleeding disorders. Patient expressed understanding and wished to proceed. All questions were answered. Sterile technique was used throughout the entire procedure. Please see nursing notes for vital signs. Test dose was given through epidural catheter and negative prior to continuing to dose epidural or start infusion. Warning signs of high block given to the patient including shortness of breath, tingling/numbness in hands, complete motor block, or any concerning symptoms with instructions to call for help. Patient was  given instructions on fall risk and not to get out of bed. All questions and concerns addressed with instructions to call with any issues or inadequate analgesia.      

## 2014-12-08 NOTE — Anesthesia Preprocedure Evaluation (Signed)
Anesthesia Evaluation  Patient identified by MRN, date of birth, ID band Patient awake    Reviewed: Allergy & Precautions, NPO status , Patient's Chart, lab work & pertinent test results  History of Anesthesia Complications Negative for: history of anesthetic complications  Airway Mallampati: II  TM Distance: >3 FB Neck ROM: Full    Dental no notable dental hx. (+) Dental Advisory Given   Pulmonary neg pulmonary ROS,    Pulmonary exam normal breath sounds clear to auscultation       Cardiovascular negative cardio ROS Normal cardiovascular exam Rhythm:Regular Rate:Normal     Neuro/Psych  Headaches, negative psych ROS   GI/Hepatic negative GI ROS, Neg liver ROS,   Endo/Other  negative endocrine ROS  Renal/GU negative Renal ROS  negative genitourinary   Musculoskeletal negative musculoskeletal ROS (+)   Abdominal   Peds negative pediatric ROS (+)  Hematology negative hematology ROS (+)   Anesthesia Other Findings   Reproductive/Obstetrics (+) Pregnancy                             Anesthesia Physical Anesthesia Plan  ASA: II  Anesthesia Plan: Epidural   Post-op Pain Management:    Induction:   Airway Management Planned:   Additional Equipment:   Intra-op Plan:   Post-operative Plan:   Informed Consent: I have reviewed the patients History and Physical, chart, labs and discussed the procedure including the risks, benefits and alternatives for the proposed anesthesia with the patient or authorized representative who has indicated his/her understanding and acceptance.     Plan Discussed with: CRNA  Anesthesia Plan Comments:         Anesthesia Quick Evaluation

## 2014-12-09 ENCOUNTER — Encounter: Payer: 59 | Admitting: Family

## 2014-12-09 NOTE — Discharge Summary (Signed)
OB Discharge Summary  Patient Name: Kim [name] DOB: 1981-01-11 MRN: [id number]  Date of admission: 12/08/2014 Delivering MD: Seabron Spates   Date of discharge: 12/09/2014  Admitting diagnosis: 39wks, [place], CTX Intrauterine pregnancy: [redacted]w[redacted]d     Secondary diagnosis: None     Discharge diagnosis: Term Pregnancy Delivered                                                                                                Post partum procedures:n/a  Augmentation: Pitocin  Complications: None  Hospital course:  Onset of Labor With Vaginal Delivery     34 y.o. yo G3P3003 at [redacted]w[redacted]d was admitted in Active Laboron 12/08/2014. Patient had an uncomplicated labor course as follows:  Membrane Rupture Time/Date: 2:00 AM ,12/08/2014   Intrapartum Procedures: Episiotomy: None [1]                                         Lacerations:  1st degree [2]  Patient had a delivery of a Viable infant. 12/08/2014  Information for the patient's newborn:  Pebble, Botkin [[id number]]  Delivery Method: Vaginal, Spontaneous Delivery (Filed from Delivery Summary)    Pateint had an uncomplicated postpartum course.  She is ambulating, tolerating a regular diet, passing flatus, and urinating well. Patient is discharged home in stable condition on No discharge date for patient encounter.Marland Kitchen    Physical exam  Filed Vitals:   12/08/14 0910 12/08/14 1345 12/08/14 1812 12/08/14 2050  BP: 100/64 104/65 111/52 89/52  Pulse: 69 56 69 74  Temp:  98.4 F (36.9 C) 97.6 F (36.4 C) 98.4 F (36.9 C)  TempSrc:  Oral Oral Oral  Resp: 20 18 20 20   Height:      Weight:      SpO2:   100%    General: alert, cooperative and no distress Lochia: appropriate Uterine Fundus: firm Incision: N/A DVT Evaluation: No evidence of DVT seen on physical exam. Negative Homan's sign. No cords or calf tenderness. Labs: Lab Results  Component Value Date   WBC 10.8* 12/08/2014   HGB 10.9* 12/08/2014   HCT 34.1* 12/08/2014    MCV 83.2 12/08/2014   PLT 228 12/08/2014   CMP Latest Ref Rng 10/28/2014  Glucose 65 - 99 mg/dL 123(H)  BUN 6 - 20 mg/dL <5(L)  Creatinine 0.44 - 1.00 mg/dL 0.49  Sodium 135 - 145 mmol/L 136  Potassium 3.5 - 5.1 mmol/L 3.5  Chloride 101 - 111 mmol/L 108  CO2 22 - 32 mmol/L 21(L)  Calcium 8.9 - 10.3 mg/dL 8.7(L)  Total Protein 6.5 - 8.1 g/dL 6.6  Total Bilirubin 0.3 - 1.2 mg/dL 0.5  Alkaline Phos 38 - 126 U/L 191(H)  AST 15 - 41 U/L 24  ALT 14 - 54 U/L 18    Discharge instruction: per After Visit Summary and "Baby and Me Booklet".  Medications:  Current facility-administered medications:  .  acetaminophen (TYLENOL) tablet 650 mg, 650 mg, Oral, Q4H PRN, Wilkie Aye  Jimmye Norman, CNM .  benzocaine-Menthol (DERMOPLAST) 20-0.5 % topical spray 1 application, 1 application, Topical, PRN, Seabron Spates, CNM .  witch hazel-glycerin (TUCKS) pad 1 application, 1 application, Topical, PRN **AND** dibucaine (NUPERCAINAL) 1 % rectal ointment 1 application, 1 application, Rectal, PRN, Seabron Spates, CNM .  diphenhydrAMINE (BENADRYL) capsule 25 mg, 25 mg, Oral, Q6H PRN, Seabron Spates, CNM .  ibuprofen (ADVIL,MOTRIN) tablet 600 mg, 600 mg, Oral, 4 times per day, Seabron Spates, CNM, 600 mg at 12/08/14 2343 .  lanolin ointment, , Topical, PRN, Seabron Spates, CNM .  lidocaine (PF) (XYLOCAINE) 1 % injection 30 mL, 30 mL, Subcutaneous, PRN, Mora Bellman, MD, 30 mL at 12/08/14 0601 .  ondansetron (ZOFRAN) tablet 4 mg, 4 mg, Oral, Q4H PRN **OR** ondansetron (ZOFRAN) injection 4 mg, 4 mg, Intravenous, Q4H PRN, Seabron Spates, CNM .  oxyCODONE-acetaminophen (PERCOCET/ROXICET) 5-325 MG per tablet 1 tablet, 1 tablet, Oral, Q4H PRN, Seabron Spates, CNM .  oxyCODONE-acetaminophen (PERCOCET/ROXICET) 5-325 MG per tablet 2 tablet, 2 tablet, Oral, Q4H PRN, Seabron Spates, CNM .  prenatal multivitamin tablet 1 tablet, 1 tablet, Oral, Q1200, Seabron Spates, CNM, 1 tablet at 12/08/14 1350 .   senna-docusate (Senokot-S) tablet 2 tablet, 2 tablet, Oral, Q24H, Seabron Spates, CNM, 2 tablet at 12/08/14 2343 .  simethicone (MYLICON) chewable tablet 80 mg, 80 mg, Oral, PRN, Seabron Spates, CNM .  zolpidem (AMBIEN) tablet 5 mg, 5 mg, Oral, QHS PRN, Seabron Spates, CNM  Facility-Administered Medications Ordered in Other Encounters:  .  lidocaine (PF) (XYLOCAINE) 1 % injection, , , Anesthesia Intra-op, Lauretta Grill, MD, 4 mL at 12/08/14 0524  Diet: routine diet  Activity: Advance as tolerated. Pelvic rest for 6 weeks.   Outpatient follow up:6 weeks  Postpartum contraception: Undecided  Newborn Data: Live born female  Birth Weight: 5 lb 15.8 oz (2715 g) APGAR: 9, 9  Baby Feeding: Breast Disposition:home with mother   12/09/2014 Abbott Pao, CNM

## 2014-12-09 NOTE — Lactation Note (Addendum)
This note was copied from the chart of Kim Ellice Boultinghouse. Lactation Consultation Note: Mother states that she just fed infant 10 ml of formula. Mother states that she formula fed and breastfed both of her other children. She states she had a good milk supply. Discussed the use of a hand pump or to increase supply and give infant EBM and formula for supplement. advised to do good breast massage and hand expression. Mother was offered a hand pump as needed. Staff nurse will give a hand pump with instructions. Mother advised to feed infant from breast before bottle feeding. Mother receptive to all teaching. Discussed that infant may have a shallow latch if mothers nipple are sore. She denies having any cracks. Reviewed baby and me book on proper latch with best milk transfer. Mother to page to have latch checked by staff nurse or [place].   Patient Name: Kim [name] [id number] Date: 12/09/2014     Maternal Data    Feeding Feeding Type: Breast Fed Length of feed: 10 min  LATCH Score/Interventions                      Lactation Tools Discussed/Used     Consult Status      Darla Lesches 12/09/2014, 3:58 PM

## 2014-12-10 ENCOUNTER — Ambulatory Visit: Payer: Self-pay

## 2014-12-10 MED ORDER — IBUPROFEN 600 MG PO TABS: 600.0000 mg | ORAL_TABLET | Freq: Four times a day (QID) | ORAL | Status: AC

## 2014-12-10 NOTE — Discharge Summary (Signed)
OB Discharge Summary  Patient Name: Kim [name] DOB: 1980/08/16 MRN: [id number]  Date of admission: 12/08/2014 Delivering MD: Seabron Spates   Date of discharge: 12/10/2014  Admitting diagnosis: 39wks, [place], CTX Intrauterine pregnancy: [redacted]w[redacted]d     Secondary diagnosis: None Principal Problem:   NSVD (normal spontaneous vaginal delivery) Active Problems:   Calculus of gallbladder   Fibroid uterus   Indication for care in labor or delivery      Discharge diagnosis: Term Pregnancy Delivered                                                                                                Post partum procedures:None  Augmentation: None  Complications: None  Hospital course:  Onset of Labor With Vaginal Delivery   34 y.o. yo G3P3003 at [redacted]w[redacted]d was admitted in Active Laboron 12/08/2014. Patient had an uncomplicated labor course as follows:  Membrane Rupture Time/Date: 2:00 AM ,12/08/2014   Intrapartum Procedures: Episiotomy: None [1]                                         Lacerations:  1st degree [2]  Patient had a delivery of a Viable infant. 12/08/2014  Information for the patient's newborn:  Zyion, Leidner [[id number]]  Delivery Method: Vaginal, Spontaneous Delivery (Filed from Delivery Summary)  Pateint had an uncomplicated postpartum course.  She is ambulating, tolerating a regular diet, passing flatus, and urinating well. Patient is discharged home in stable condition on 12/10/2014 12:40 PM.    Physical exam  Filed Vitals:   12/09/14 0835 12/09/14 1300 12/09/14 1815 12/10/14 0603  BP: 113/64 102/62 100/61 95/54  Pulse: 75 93 72 62  Temp: 97.8 F (36.6 C) 98 F (36.7 C) 98.2 F (36.8 C) 97.6 F (36.4 C)  TempSrc: Oral Oral Oral Oral  Resp: 21  12 16   Height:      Weight:      SpO2:   100%    General: alert and cooperative Lochia: appropriate Uterine Fundus: firm Incision: N/A DVT Evaluation: No evidence of DVT seen on physical exam. No significant  calf/ankle edema. Labs: Lab Results  Component Value Date   WBC 10.8* 12/08/2014   HGB 10.9* 12/08/2014   HCT 34.1* 12/08/2014   MCV 83.2 12/08/2014   PLT 228 12/08/2014   CMP Latest Ref Rng 10/28/2014  Glucose 65 - 99 mg/dL 123(H)  BUN 6 - 20 mg/dL <5(L)  Creatinine 0.44 - 1.00 mg/dL 0.49  Sodium 135 - 145 mmol/L 136  Potassium 3.5 - 5.1 mmol/L 3.5  Chloride 101 - 111 mmol/L 108  CO2 22 - 32 mmol/L 21(L)  Calcium 8.9 - 10.3 mg/dL 8.7(L)  Total Protein 6.5 - 8.1 g/dL 6.6  Total Bilirubin 0.3 - 1.2 mg/dL 0.5  Alkaline Phos 38 - 126 U/L 191(H)  AST 15 - 41 U/L 24  ALT 14 - 54 U/L 18    Discharge instruction: per After Visit Summary and "Baby and Me Booklet".  Medications:  Current facility-administered medications:  .  acetaminophen (TYLENOL) tablet 650 mg, 650 mg, Oral, Q4H PRN, Seabron Spates, CNM .  benzocaine-Menthol (DERMOPLAST) 20-0.5 % topical spray 1 application, 1 application, Topical, PRN, Seabron Spates, CNM .  witch hazel-glycerin (TUCKS) pad 1 application, 1 application, Topical, PRN **AND** dibucaine (NUPERCAINAL) 1 % rectal ointment 1 application, 1 application, Rectal, PRN, Seabron Spates, CNM .  diphenhydrAMINE (BENADRYL) capsule 25 mg, 25 mg, Oral, Q6H PRN, Seabron Spates, CNM .  ibuprofen (ADVIL,MOTRIN) tablet 600 mg, 600 mg, Oral, 4 times per day, Seabron Spates, CNM, 600 mg at 12/10/14 1122 .  lanolin ointment, , Topical, PRN, Seabron Spates, CNM .  ondansetron (ZOFRAN) tablet 4 mg, 4 mg, Oral, Q4H PRN **OR** ondansetron (ZOFRAN) injection 4 mg, 4 mg, Intravenous, Q4H PRN, Seabron Spates, CNM .  oxyCODONE-acetaminophen (PERCOCET/ROXICET) 5-325 MG per tablet 1 tablet, 1 tablet, Oral, Q4H PRN, Seabron Spates, CNM .  oxyCODONE-acetaminophen (PERCOCET/ROXICET) 5-325 MG per tablet 2 tablet, 2 tablet, Oral, Q4H PRN, Seabron Spates, CNM .  prenatal multivitamin tablet 1 tablet, 1 tablet, Oral, Q1200, Seabron Spates, CNM, 1 tablet at 12/10/14  1122 .  senna-docusate (Senokot-S) tablet 2 tablet, 2 tablet, Oral, Q24H, Seabron Spates, CNM, 2 tablet at 12/09/14 2355 .  simethicone (MYLICON) chewable tablet 80 mg, 80 mg, Oral, PRN, Seabron Spates, CNM .  zolpidem (AMBIEN) tablet 5 mg, 5 mg, Oral, QHS PRN, Seabron Spates, CNM  Current outpatient prescriptions:  .  ibuprofen (ADVIL,MOTRIN) 600 MG tablet, Take 1 tablet (600 mg total) by mouth every 6 (six) hours., Disp: 30 tablet, Rfl: 0  Facility-Administered Medications Ordered in Other Encounters:  .  lidocaine (PF) (XYLOCAINE) 1 % injection, , , Anesthesia Intra-op, Lauretta Grill, MD, 4 mL at 12/08/14 0524  Diet: routine diet  Activity: Advance as tolerated. Pelvic rest for 6 weeks.   Outpatient follow up:6 weeks, at [facility]  Postpartum contraception: Condoms  Newborn Data: Live born female  Birth Weight: 5 lb 15.8 oz (2715 g) APGAR: 9, 9  Baby Feeding: Breast Disposition:home with mother  12/10/2014 Caren Macadam, MD

## 2014-12-10 NOTE — Progress Notes (Signed)
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

## 2014-12-10 NOTE — Progress Notes (Signed)
Discharge papers and instructions discussed with patient. Denies questions at this time. Husband will not be able to be her for 3 more hours.

## 2014-12-10 NOTE — Discharge Instructions (Signed)
Kim [name], congratulations on delivering your baby!  For pain, you have a prescription for high dose ibuprofen.  Until your postpartum follow-up, we recommend vaginal rest, meaning no vaginal intercourse or placing anything in your vagina, like tampons.   Postpartum Care After Vaginal Delivery After you deliver your newborn (postpartum period), the usual stay in the hospital is 24-72 hours. If there were problems with your labor or delivery, or if you have other medical problems, you might be in the hospital longer.  While you are in the hospital, you will receive help and instructions on how to care for yourself and your newborn during the postpartum period.  While you are in the hospital:  Be sure to tell your nurses if you have pain or discomfort, as well as where you feel the pain and what makes the pain worse.  If you had an incision made near your vagina (episiotomy) or if you had some tearing during delivery, the nurses may put ice packs on your episiotomy or tear. The ice packs may help to reduce the pain and swelling.  If you are breastfeeding, you may feel uncomfortable contractions of your uterus for a couple of weeks. This is normal. The contractions help your uterus get back to normal size.  It is normal to have some bleeding after delivery.  For the first 1-3 days after delivery, the flow is red and the amount may be similar to a period.  It is common for the flow to start and stop.  In the first few days, you may pass some small clots. Let your nurses know if you begin to pass large clots or your flow increases.  Do not  flush blood clots down the toilet before having the nurse look at them.  During the next 3-10 days after delivery, your flow should become more watery and pink or brown-tinged in color.  Ten to fourteen days after delivery, your flow should be a small amount of yellowish-white discharge.  The amount of your flow will decrease over the first few weeks  after delivery. Your flow may stop in 6-8 weeks. Most women have had their flow stop by 12 weeks after delivery.  You should change your sanitary pads frequently.  Wash your hands thoroughly with soap and water for at least 20 seconds after changing pads, using the toilet, or before holding or feeding your newborn.  You should feel like you need to empty your bladder within the first 6-8 hours after delivery.  In case you become weak, lightheaded, or faint, call your nurse before you get out of bed for the first time and before you take a shower for the first time.  Within the first few days after delivery, your breasts may begin to feel tender and full. This is called engorgement. Breast tenderness usually goes away within 48-72 hours after engorgement occurs. You may also notice milk leaking from your breasts. If you are not breastfeeding, do not stimulate your breasts. Breast stimulation can make your breasts produce more milk.  Spending as much time as possible with your newborn is very important. During this time, you and your newborn can feel close and get to know each other. Having your newborn stay in your room (rooming in) will help to strengthen the bond with your newborn. It will give you time to get to know your newborn and become comfortable caring for your newborn.  Your hormones change after delivery. Sometimes the hormone changes can temporarily cause you to  feel sad or tearful. These feelings should not last more than a few days. If these feelings last longer than that, you should talk to your caregiver.  If desired, talk to your caregiver about methods of family planning or contraception.  Talk to your caregiver about immunizations. Your caregiver may want you to have the following immunizations before leaving the hospital:  Tetanus, diphtheria, and pertussis (Tdap) or tetanus and diphtheria (Td) immunization. It is very important that you and your family (including  grandparents) or others caring for your newborn are up-to-date with the Tdap or Td immunizations. The Tdap or Td immunization can help protect your newborn from getting ill.  Rubella immunization.  Varicella (chickenpox) immunization.  Influenza immunization. You should receive this annual immunization if you did not receive the immunization during your pregnancy.   This information is not intended to replace advice given to you by your health care provider. Make sure you discuss any questions you have with your health care provider.   Document Released: 12/13/2006 Document Revised: 11/10/2011 Document Reviewed: 10/13/2011 Elsevier Interactive Patient Education [employer].

## 2014-12-10 NOTE — Lactation Note (Signed)
This note was copied from the chart of Kim Layanna Charo. Lactation Consultation Note; Mother describes pain when infant latches. She states that she had pain with other children when breastfeeding for a few weeks. Mother has very full breast. Mother was taught to hand express and do reverse pressure to soften breast tissue. Assist mother with latching infant. Mother describes pain of a #7. Adjust infants lower jaw for wider gape with some relief for mother. Advised mother to begin to post pump for 15 mins on each breast. Discussed engorgement treatment. Mother states that she plans to do mostly pumping and bottle feeding with infant. Mother has not phoned her insurance company about a pump. Mother was offered a 2 week rental. She states she will just use her hand pump . Advised mother on supplementing infant with ebm before using formula to limit engorgement . Mother states she will follow up as needed.   Patient Name: Kim [name] [id number] Date: 12/10/2014 Reason for consult: Follow-up assessment   Maternal Data    Feeding Feeding Type: Breast Fed Length of feed: 20 min  LATCH Score/Interventions                      Lactation Tools Discussed/Used     Consult Status      Darla Lesches 12/10/2014, 12:38 PM

## 2015-01-21 ENCOUNTER — Encounter: Payer: Self-pay | Admitting: Obstetrics and Gynecology

## 2015-01-21 ENCOUNTER — Ambulatory Visit (INDEPENDENT_AMBULATORY_CARE_PROVIDER_SITE_OTHER): Payer: 59 | Admitting: Obstetrics and Gynecology

## 2015-01-21 ENCOUNTER — Ambulatory Visit: Payer: 59 | Admitting: Obstetrics and Gynecology

## 2015-01-21 DIAGNOSIS — F53 Postpartum depression: Secondary | ICD-10-CM

## 2015-01-21 DIAGNOSIS — O99345 Other mental disorders complicating the puerperium: Secondary | ICD-10-CM

## 2015-01-21 NOTE — Progress Notes (Signed)
  Subjective:     Kim [name] is a 34 y.o. female who presents for a postpartum visit. She is 7 weeks postpartum following a spontaneous vaginal delivery. I have fully reviewed the prenatal and intrapartum course. The delivery was at 7 gestational weeks. Outcome: spontaneous vaginal delivery. Anesthesia: epidural. Postpartum course has been uncomplicated. Baby's course has been uncomplicated. Baby is feeding by both breast and bottle - Similac Advance. Bleeding no bleeding. Bowel function is normal. Bladder function is normal. Patient is not sexually active. Contraception method is abstinence. Postpartum depression screening: positive. Patient reports receiving ample help at home. She denies suicidal or homicidal ideations.     Review of Systems Pertinent items are noted in HPI.   Objective:    BP 98/63 mmHg  Pulse 75  Resp 16  Ht 5\' 5"  (1.651 m)  Wt 166 lb (75.297 kg)  BMI 27.62 kg/m2  Breastfeeding? Yes  General:  alert, cooperative and no distress   Breasts:  inspection negative, no nipple discharge or bleeding, no masses or nodularity palpable  Lungs: clear to auscultation bilaterally  Heart:  regular rate and rhythm  Abdomen: soft, non-tender; bowel sounds normal; no masses,  no organomegaly   Vulva:  normal  Vagina: normal vagina, no discharge, exudate, lesion, or erythema  Cervix:  multiparous appearance  Corpus: normal size, contour, position, consistency, mobility, non-tender  Adnexa:  normal adnexa and no mass, fullness, tenderness  Rectal Exam: Not performed.        Assessment:     Normal postpartum exam. Pap smear not done at today's visit.   Plan:    1. Contraception: condoms. She is considering depo-provera but will discuss with her husband 2. Discussed pp depression screen results with the patient. She is not interested in initiating any antidepressants at this time. She feels overwhelmed at times but feels it will self resolve. Encouraged patient to return or call  with concerns 3. Patient is medically cleared to resume all activities of daily living 4. Follow up in: 6 months for annual exam or as needed.

## 2015-01-22 NOTE — Progress Notes (Signed)
Patient ID: Kim [name], female   DOB: [birth year], 34 y.o.   MRN: [id number] Patient was seen for postpartum visit on that day. See separate note

## 2015-03-26 DIAGNOSIS — K603 Anal fistula: Secondary | ICD-10-CM | POA: Insufficient documentation

## 2015-04-15 DIAGNOSIS — Z9889 Other specified postprocedural states: Secondary | ICD-10-CM | POA: Insufficient documentation

## 2015-05-28 ENCOUNTER — Other Ambulatory Visit (HOSPITAL_COMMUNITY)
Admission: RE | Admit: 2015-05-28 | Discharge: 2015-05-28 | Disposition: A | Discharge status: Home / Self Care | Payer: Medicaid Other | Source: Ambulatory Visit | Attending: Obstetrics & Gynecology | Admitting: Obstetrics & Gynecology

## 2015-05-28 ENCOUNTER — Encounter: Payer: Self-pay | Admitting: Obstetrics & Gynecology

## 2015-05-28 ENCOUNTER — Ambulatory Visit (INDEPENDENT_AMBULATORY_CARE_PROVIDER_SITE_OTHER): Payer: Medicaid Other | Admitting: Obstetrics & Gynecology

## 2015-05-28 VITALS — BP 100/64 | HR 79 | Ht 65.0 in | Wt 155.0 lb

## 2015-05-28 DIAGNOSIS — Z01419 Encounter for gynecological examination (general) (routine) without abnormal findings: Secondary | ICD-10-CM

## 2015-05-28 DIAGNOSIS — Z3049 Encounter for surveillance of other contraceptives: Secondary | ICD-10-CM | POA: Diagnosis not present

## 2015-05-28 DIAGNOSIS — Z1151 Encounter for screening for human papillomavirus (HPV): Secondary | ICD-10-CM | POA: Insufficient documentation

## 2015-05-28 MED ORDER — BENZONATATE 100 MG PO CAPS: 100.0000 mg | ORAL_CAPSULE | Freq: Three times a day (TID) | ORAL | Status: DC | PRN

## 2015-05-28 NOTE — Progress Notes (Signed)
Subjective:    Kim [name] is a 35 y.o. M Pakistani P3 ( 44, 42 yo kids + newborn) female who presents for an annual exam. The patient has no complaints today. The patient is sexually active. GYN screening history: last pap: was normal. The patient wears seatbelts: yes. The patient participates in regular exercise: no. Has the patient ever been transfused or tattooed?: no. The patient reports that there is not domestic violence in her life.   Menstrual History: OB History    Gravida Para Term Preterm AB TAB SAB Ectopic Multiple Living   3 3 3       0 3      Menarche age: 21  No LMP recorded.    The following portions of the patient's history were reviewed and updated as appropriate: allergies, current medications, past family history, past medical history, past social history, past surgical history and problem list.  Review of Systems Pertinent items are noted in HPI.  Homemaker. Married for 12 years. Using condoms for contraception. 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   Objective:    BP 100/64 mmHg  Pulse 79  Ht 5\' 5"  (1.651 m)  Wt 155 lb (70.308 kg)  BMI 25.79 kg/m2  General Appearance:    Alert, cooperative, no distress, appears stated age  Head:    Normocephalic, without obvious abnormality, atraumatic  Eyes:    PERRL, conjunctiva/corneas clear, EOM's intact, fundi    benign, both eyes  Ears:    Normal TM's and external ear canals, both ears  Nose:   Nares normal, septum midline, mucosa normal, no drainage    or sinus tenderness  Throat:   Lips, mucosa, and tongue normal; teeth and gums normal  Neck:   Supple, symmetrical, trachea midline, no adenopathy;    thyroid:  no enlargement/tenderness/nodules; no carotid   bruit or JVD  Back:     Symmetric, no curvature, ROM normal, no CVA tenderness  Lungs:     Clear to auscultation bilaterally, respirations unlabored  Chest Wall:    No tenderness or deformity   Heart:     Regular rate and rhythm, S1 and S2 normal, no murmur, rub   or gallop  Breast Exam:    No tenderness, masses, or nipple abnormality  Abdomen:     Soft, non-tender, bowel sounds active all four quadrants,    no masses, no organomegaly  Genitalia:    Normal female without lesion, discharge or tenderness, NSSA, NT, no palpable masses     Extremities:   Extremities normal, atraumatic, no cyanosis or edema  Pulses:   2+ and symmetric all extremities  Skin:   Skin color, texture, turgor normal, no rashes or lesions  Lymph nodes:   Cervical, supraclavicular, and axillary nodes normal  Neurologic:   CNII-XII intact, normal strength, sensation and reflexes    throughout  .    Assessment:    Healthy female exam.    Plan:     Breast self exam technique reviewed and patient encouraged to perform self-exam monthly. Thin prep Pap smear.

## 2015-05-29 LAB — CYTOLOGY - PAP

## 2015-06-03 DIAGNOSIS — Z9889 Other specified postprocedural states: Secondary | ICD-10-CM | POA: Insufficient documentation

## 2015-07-18 ENCOUNTER — Ambulatory Visit (INDEPENDENT_AMBULATORY_CARE_PROVIDER_SITE_OTHER): Payer: BLUE CROSS/BLUE SHIELD | Admitting: Osteopathic Medicine

## 2015-07-18 ENCOUNTER — Encounter: Payer: Self-pay | Admitting: Osteopathic Medicine

## 2015-07-18 VITALS — BP 100/65 | HR 90 | Ht 65.0 in | Wt 153.0 lb

## 2015-07-18 DIAGNOSIS — K529 Noninfective gastroenteritis and colitis, unspecified: Secondary | ICD-10-CM

## 2015-07-18 DIAGNOSIS — K603 Anal fistula: Secondary | ICD-10-CM

## 2015-07-18 MED ORDER — ONDANSETRON HCL 8 MG PO TABS: 8.0000 mg | ORAL_TABLET | Freq: Three times a day (TID) | ORAL | Status: DC

## 2015-07-18 MED ORDER — LOPERAMIDE HCL 2 MG PO TABS: 2.0000 mg | ORAL_TABLET | Freq: Three times a day (TID) | ORAL | Status: AC

## 2015-07-18 NOTE — Progress Notes (Signed)
HPI: Kim [name] is a 35 y.o. female who [facility] today for chief complaint of:  Chief Complaint  Patient presents with  . Establish Care    DIGESTIVE CONCERNS    DIGESTIVE ISSUES . Quality: frequent stool but stool is otherwise normal . Duration: going on for years but seems to be getting worse past few months . Timing: bowel movements after eating. Several times per day. Episodes can be days or weeks at a time but then goes back to normal . Modifying factors: anal fistula surgery 2 months ago, perirectal abscess 09/2059 . Assoc signs/symptoms: no blood or mucus in stool. No abdominal pain. Patient also states that since her surgery she has some pain in the midline near rectal some occasionally shooting down the inner groin/thigh.  Records reviewed: normal GYN exam 05/28/15, no comment on rectal exam  05/2015 colorectal surgery notes:  "Status post partial fistulotomy with seton placement on 04/15/2015 Yes  . status post exam under anesthesia with removal of seton on 06/03/2015  Having much less pain out of the seton is out. On examination in the left anterior location there is still present a small fistula opening. The surrounding area is soft. On anoscopy the 9 small cavity in the left anterior location is still evident. It was my impression at the time the surgery that attempting to treat this significant fistula associated with a chronic cavity would create more symptomatic problems for her then just removing the seton and monitoring her over time. So far this seems to be true. I told the patient and her husband that sometimes the treatment can be worse than the problem itself and therefore at this time I would recommend not proceeding with a complex perianal operation. In the future if she does have worsening symptoms and she may need an endorectal advancement flap type procedure but I think there would be a reasonable chance of failure based on  the anatomy of this. I will see her back in 2 months for reevaluation."   Past medical, social and family history reviewed: Past Medical History  Diagnosis Date  . Hemorrhoids    Past Surgical History  Procedure Laterality Date  . Cholecystectomy    . Incision and drainage perirectal abscess N/A 10/29/2014    Procedure: IRRIGATION AND DEBRIDEMENT PERIRECTAL ABSCESS;  Surgeon: Coralie Keens, MD;  Location: [facility];  Service: General;  Laterality: N/A;   Social History  Substance Use Topics  . Smoking status: Never Smoker   . Smokeless tobacco: Never Used  . Alcohol Use: No   Family History  Problem Relation Age of Onset  . Diabetes Mother     Current Outpatient Prescriptions  Medication Sig Dispense Refill  . acetaminophen (TYLENOL) 500 MG tablet Take 500 mg by mouth every 6 (six) hours as needed.    . Ginger, Zingiber officinalis, (GINGER EXTRACT PO) Take by mouth.    Marland Kitchen ibuprofen (ADVIL,MOTRIN) 600 MG tablet Take 1 tablet (600 mg total) by mouth every 6 (six) hours. 30 tablet 0   No current facility-administered medications for this visit.   No Known Allergies    Review of Systems: CONSTITUTIONAL:  No  fever, no chills, No  unintentional weight changes HEAD/EYES/EARS/NOSE/THROAT: No  headache, no vision change, no hearing change, No  sore throat, No  sinus pressure CARDIAC: No  chest pain, No  pressure, No palpitations, No  orthopnea RESPIRATORY: No  cough, No  shortness of breath/wheeze GASTROINTESTINAL: No  nausea, No  vomiting, No  abdominal pain, No  blood in stool, No  diarrhea, No  Constipation, other GI issues as per HPI MUSCULOSKELETAL: No  myalgia/arthralgia GENITOURINARY: No  incontinence, No  abnormal genital bleeding/discharge SKIN: No  rash/wounds/concerning lesions HEM/ONC: No  easy bruising/bleeding, No  abnormal lymph node ENDOCRINE: No polyuria/polydipsia/polyphagia, No  heat/cold intolerance  NEUROLOGIC: (+) fatigue/weakness, No  dizziness, No  slurred  speech PSYCHIATRIC: No  concerns with depression, No  concerns with anxiety, No sleep problems  Exam:  BP 100/65 mmHg  Pulse 90  Ht 5\' 5"  (1.651 m)  Wt 153 lb (69.4 kg)  BMI 25.46 kg/m2 Constitutional: VS see above. General Appearance: alert, well-developed, well-nourished, NAD Eyes: Normal lids and conjunctive, non-icteric sclera,  Ears, Nose, Mouth, Throat: MMM, Normal external inspection ears/nares/mouth/lips/gums,  Neck: No masses, trachea midline. No thyroid enlargement/tenderness/mass appreciated. No lymphadenopathy Respiratory: Normal respiratory effort. no wheeze, no rhonchi, no rales Cardiovascular: S1/S2 normal, no murmur, no rub/gallop auscultated. RRR. No lower extremity edema. Gastrointestinal: Nontender, no masses. No hepatomegaly, no splenomegaly. No hernia appreciated. Bowel sounds normal. Normal percussion. Rectal exam deferred.  Musculoskeletal: Gait normal. No clubbing/cyanosis of digits.   Psychiatric: Normal judgment/insight. Normal mood and affect. Oriented x3.      ASSESSMENT/PLAN: Problem of frequent stool seems to predate her issues with the perirectal abscess and anal fistula, though depending on the severity of scar tissue from these procedures, trauma to the sphincter may be exacerbating her symptoms, though she is not complaining of any anal leakage or stool incontinence. Symptoms most likely correlate with irritable bowel type syndrome, hyperactive gastrocolic reflex. Labs as below, since no diarrhea I'm not compelled to get any stool testing at this time but would consider stool studies or GI referral if conservative management is not improving. Consider adding amitriptyline. Low-fodmap diet given. Plan to follow-up in 4 weeks, sooner if needed. She is following with surgeons, she is somewhat uncomfortable discussing most of her symptoms with them since they are female physicians.  Frequent stools - likely irritable bowel syndrome - Plan: CBC with  Differential/Platelet, COMPLETE METABOLIC PANEL WITH GFR, TSH, Tissue transglutaminase, IgA, loperamide (IMODIUM A-D) 2 MG tablet, ondansetron (ZOFRAN) 8 MG tablet  Fistula, anal - s/p repair, hx anal/rectal abscess     All questions were answered. Visit summary with updated medication list and pertinent instructions was printed for patient. ER/RTC precautions were reviewed with the patient. No Follow-up on file.

## 2015-07-19 LAB — COMPLETE METABOLIC PANEL WITH GFR
ALBUMIN: 4.6 g/dL (ref 3.6–5.1)
ALK PHOS: 48 U/L (ref 33–115)
ALT: 10 U/L (ref 6–29)
AST: 15 U/L (ref 10–30)
BUN: 13 mg/dL (ref 7–25)
CALCIUM: 9.4 mg/dL (ref 8.6–10.2)
CHLORIDE: 104 mmol/L (ref 98–110)
CO2: 24 mmol/L (ref 20–31)
CREATININE: 0.54 mg/dL (ref 0.50–1.10)
GFR, Est African American: >89 mL/min (ref >=60)
GFR, Est Non African American: >89 mL/min (ref >=60)
GLUCOSE: 74 mg/dL (ref 65–99)
Potassium: 4.4 mmol/L (ref 3.5–5.3)
SODIUM: 138 mmol/L (ref 135–146)
Total Bilirubin: 0.6 mg/dL (ref 0.2–1.2)
Total Protein: 7.5 g/dL (ref 6.1–8.1)

## 2015-07-19 LAB — CBC WITH DIFFERENTIAL/PLATELET
BASOS PCT: 0 %
Basophils Absolute: 0 cells/uL (ref 0–200)
Eosinophils Absolute: 273 cells/uL (ref 15–500)
Eosinophils Relative: 3 %
HEMATOCRIT: 37.5 % (ref 35.0–45.0)
HEMOGLOBIN: 12.1 g/dL (ref 11.7–15.5)
LYMPHS ABS: 3640 {cells}/uL (ref 850–3900)
Lymphocytes Relative: 40 %
MCH: 27.8 pg (ref 27.0–33.0)
MCHC: 32.3 g/dL (ref 32.0–36.0)
MCV: 86 fL (ref 80.0–100.0)
MONO ABS: 455 {cells}/uL (ref 200–950)
MPV: 10.5 fL (ref 7.5–12.5)
Monocytes Relative: 5 %
NEUTROS ABS: 4732 {cells}/uL (ref 1500–7800)
Neutrophils Relative %: 52 %
Platelets: 238 10*3/uL (ref 140–400)
RBC: 4.36 MIL/uL (ref 3.80–5.10)
RDW: 14.6 % (ref 11.0–15.0)
WBC: 9.1 10*3/uL (ref 3.8–10.8)

## 2015-07-19 LAB — TSH: TSH: 1.98 m[IU]/L

## 2015-07-21 LAB — TISSUE TRANSGLUTAMINASE, IGA: Tissue Transglutaminase Ab, IgA: 1 U/mL (ref <4)

## 2015-12-10 ENCOUNTER — Encounter: Payer: Self-pay | Admitting: Osteopathic Medicine

## 2015-12-10 ENCOUNTER — Ambulatory Visit (INDEPENDENT_AMBULATORY_CARE_PROVIDER_SITE_OTHER): Payer: BLUE CROSS/BLUE SHIELD | Admitting: Osteopathic Medicine

## 2015-12-10 VITALS — BP 97/57 | HR 63 | Ht 65.0 in | Wt 153.0 lb

## 2015-12-10 DIAGNOSIS — Z23 Encounter for immunization: Secondary | ICD-10-CM

## 2015-12-10 DIAGNOSIS — K529 Noninfective gastroenteritis and colitis, unspecified: Secondary | ICD-10-CM

## 2015-12-10 DIAGNOSIS — Z8719 Personal history of other diseases of the digestive system: Secondary | ICD-10-CM | POA: Diagnosis not present

## 2015-12-10 DIAGNOSIS — Z0000 Encounter for general adult medical examination without abnormal findings: Secondary | ICD-10-CM | POA: Diagnosis not present

## 2015-12-10 MED ORDER — ONDANSETRON HCL 8 MG PO TABS: 8.0000 mg | ORAL_TABLET | Freq: Three times a day (TID) | ORAL | 1 refills | Status: DC

## 2015-12-10 NOTE — Progress Notes (Signed)
HPI: Kim [name] is a 35 y.o. female  who presents to [facility] today, 12/10/15,  for chief complaint of:  Chief Complaint  Patient presents with  . Annual Exam    No complaints today. See below for preventive care.   Intermittent use of Zofran for loose stool / IBS symptoms, requests refill.    Past medical, surgical, social and family history reviewed: Past Medical History:  Diagnosis Date  . Hemorrhoids    Past Surgical History:  Procedure Laterality Date  . CHOLECYSTECTOMY    . INCISION AND DRAINAGE PERIRECTAL ABSCESS N/A 10/29/2014   Procedure: IRRIGATION AND DEBRIDEMENT PERIRECTAL ABSCESS;  Surgeon: Coralie Keens, MD;  Location: [facility];  Service: General;  Laterality: N/A;   Social History  Substance Use Topics  . Smoking status: Never Smoker  . Smokeless tobacco: Never Used  . Alcohol use No   Family History  Problem Relation Age of Onset  . Diabetes Mother      Current medication list and allergy/intolerance information reviewed:   Current Outpatient Prescriptions  Medication Sig Dispense Refill  . acetaminophen (TYLENOL) 500 MG tablet Take 500 mg by mouth every 6 (six) hours as needed.    . Ginger, Zingiber officinalis, (GINGER EXTRACT PO) Take by mouth.    Marland Kitchen ibuprofen (ADVIL,MOTRIN) 600 MG tablet Take 1 tablet (600 mg total) by mouth every 6 (six) hours. 30 tablet 0  . loperamide (IMODIUM A-D) 2 MG tablet Take 1 tablet (2 mg total) by mouth 3 (three) times daily before meals. 90 tablet 1  . ondansetron (ZOFRAN) 8 MG tablet Take 1 tablet (8 mg total) by mouth 3 (three) times daily before meals. 90 tablet 1   No current facility-administered medications for this visit.    No Known Allergies    Review of Systems:  Constitutional:  No  fever, no chills, No recent illness, No unintentional weight changes. No significant fatigue.   HEENT: No  headache, no vision change  Cardiac: No  chest pain, No   pressure  Respiratory:  No  shortness of breath. No  Cough  Gastrointestinal: No  abdominal pain, No  nausea, No  vomiting,  No  blood in stool, No  Diarrhea but occasional loose stool, No  constipation   Musculoskeletal: No new myalgia/arthralgia  Genitourinary: No  incontinence, No  abnormal genital bleeding, No abnormal genital discharge  Skin: No  Rash, No other wounds/concerning lesions  Hem/Onc: No  easy bruising/bleeding, No  abnormal lymph node  Neurologic: No  weakness, No  dizziness  Psychiatric: No  concerns with depression, No  concerns with anxiety  Exam:  BP (!) 97/57   Pulse 63   Ht 5\' 5"  (1.651 m)   Wt 153 lb (69.4 kg)   BMI 25.46 kg/m   Constitutional: VS see above. General Appearance: alert, well-developed, well-nourished, NAD  Eyes: Normal lids and conjunctive, non-icteric sclera  Ears, Nose, Mouth, Throat: MMM, Normal external inspection ears/nares/mouth/lips/gums. TM normal bilaterally. Pharynx/tonsils no erythema, no exudate. Nasal mucosa normal. PERRLA. L eye strabismus  Neck: No masses, trachea midline. No thyroid enlargement. No tenderness/mass appreciated. No lymphadenopathy  Respiratory: Normal respiratory effort. no wheeze, no rhonchi, no rales  Cardiovascular: S1/S2 normal, no murmur, no rub/gallop auscultated. RRR. No lower extremity edema.   Gastrointestinal: Nontender, no masses. No hepatomegaly, no splenomegaly. No hernia appreciated. Bowel sounds normal. Rectal exam deferred.   Musculoskeletal: Gait normal. No clubbing/cyanosis of digits.   Neurological: Normal balance/coordination. No tremor.  Skin: warm, dry, intact. No rash/ulcer. No concerning nevi or subq nodules on limited exam.    Psychiatric: Normal judgment/insight. Normal mood and affect. Oriented x3.     ASSESSMENT/PLAN:   Annual physical exam  Need for prophylactic vaccination and inoculation against influenza - Plan: Flu Vaccine QUAD 36+ mos IM  Frequent stools -  likely irritable bowel syndrome - Plan: ondansetron (ZOFRAN) 8 MG tablet  History of rectal fissure - Has questions regarding risk of this with colon cancer or future pregnancies. Advised low risk but keep follow-up with surgeons/OB    FEMALE PREVENTIVE CARE  ANNUAL SCREENING/COUNSELING Tobacco - noNever  Alcohol - none Diet/Exercise - HEALTHY HABITS DISCUSSED TO DECREASE CV RISK Depression - PQH2 Negative Domestic violence concerns - no HTN SCREENING - SEE VITALS Vaccination status - SEE BELOW  SEXUAL HEALTH Sexually active in the past year - yes With - Yes with female. STI - The patient denies history of sexually transmitted disease. STI testing today? - no G4P3 - no GDM2  INFECTIOUS DISEASE SCREENING HIV - all adults 15-65 - does not need GC/CT - sexually active - does not need HepC - DOB [birth year] - does not need TB - does not need  DISEASE SCREENING Lipid - does not need DM2 - does not need Osteoporosis - does not need  CANCER SCREENING Cervical - does not need Breast - MAMMO - does not need Lung - does not need Colon - does not need  ADULT VACCINATION Influenza - was given Td - was not indicated HPV - was not indicated Zoster - was not indicated Pneumonia - was not indicated  OTHER Fall - exercise and Vit D age 76+ - does not need Consider ASA - age 59-59 - does not need   Patient Instructions  Ondansetron is a relatively safe medicine and ok to take in pregnancy, but caution in lactation. Can call us or talk to your pharmacist if any questions about medications.   Fistula should not cause problems with pregnancy, but be sure you are following up regularly as directed by your surgeons, and talk to your OBGYN if you are thinking of trying to get pregnant.      Visit summary with medication list and pertinent instructions was printed for patient to review. All questions at time of visit were answered - patient instructed to contact office with any  additional concerns. ER/RTC precautions were reviewed with the patient. Follow-up plan: Return in about 1 year (around 12/09/2016) for [place] sooner if needed.

## 2015-12-10 NOTE — Patient Instructions (Signed)
Ondansetron is a relatively safe medicine and ok to take in pregnancy, but caution in lactation. Can call us or talk to your pharmacist if any questions about medications.   Fistula should not cause problems with pregnancy, but be sure you are following up regularly as directed by your surgeons, and talk to your OBGYN if you are thinking of trying to get pregnant.

## 2016-10-14 ENCOUNTER — Telehealth: Payer: Self-pay | Admitting: Osteopathic Medicine

## 2016-10-14 NOTE — Telephone Encounter (Signed)
Patient called requesting to get a script refill for imodium which is helping with her stomach issues. Thanks

## 2016-10-20 NOTE — Telephone Encounter (Signed)
Patient is coming in for a physical.

## 2016-10-22 ENCOUNTER — Encounter: Payer: Self-pay | Admitting: Osteopathic Medicine

## 2016-10-22 ENCOUNTER — Ambulatory Visit (INDEPENDENT_AMBULATORY_CARE_PROVIDER_SITE_OTHER): Payer: BLUE CROSS/BLUE SHIELD | Admitting: Osteopathic Medicine

## 2016-10-22 VITALS — BP 100/67 | HR 67 | Ht 65.0 in | Wt 151.0 lb

## 2016-10-22 DIAGNOSIS — R5383 Other fatigue: Secondary | ICD-10-CM | POA: Diagnosis not present

## 2016-10-22 DIAGNOSIS — K589 Irritable bowel syndrome without diarrhea: Secondary | ICD-10-CM | POA: Diagnosis not present

## 2016-10-22 DIAGNOSIS — Z23 Encounter for immunization: Secondary | ICD-10-CM | POA: Diagnosis not present

## 2016-10-22 DIAGNOSIS — Z0000 Encounter for general adult medical examination without abnormal findings: Secondary | ICD-10-CM

## 2016-10-22 DIAGNOSIS — Z1331 Encounter for screening for depression: Secondary | ICD-10-CM

## 2016-10-22 DIAGNOSIS — K529 Noninfective gastroenteritis and colitis, unspecified: Secondary | ICD-10-CM

## 2016-10-22 DIAGNOSIS — Z1389 Encounter for screening for other disorder: Secondary | ICD-10-CM | POA: Diagnosis not present

## 2016-10-22 LAB — CBC
HEMATOCRIT: 39.8 % (ref 35.0–45.0)
HEMOGLOBIN: 12.7 g/dL (ref 11.7–15.5)
MCH: 28.7 pg (ref 27.0–33.0)
MCHC: 31.9 g/dL — AB (ref 32.0–36.0)
MCV: 89.8 fL (ref 80.0–100.0)
MPV: 10.3 fL (ref 7.5–12.5)
Platelets: 198 10*3/uL (ref 140–400)
RBC: 4.43 MIL/uL (ref 3.80–5.10)
RDW: 13.2 % (ref 11.0–15.0)
WBC: 7.3 10*3/uL (ref 3.8–10.8)

## 2016-10-22 MED ORDER — ONDANSETRON HCL 8 MG PO TABS: 8.0000 mg | ORAL_TABLET | Freq: Three times a day (TID) | ORAL | 1 refills | Status: AC

## 2016-10-22 NOTE — Progress Notes (Signed)
HPI: Kim [name] is a 36 y.o. female  who presents to [name] today, 10/22/16,  for chief complaint of:  Chief Complaint  Patient presents with  . Annual Exam      Patient here for annual physical / wellness exam.  See preventive care reviewed as below.  Recent labs reviewed.  Additional concerns today include:   IBS: needs refill of Zofran. No diarrhea/loose stool but patient is bothered by several bowel movements per day, on and off every few months to flare up. Has been out of Zofran, this has worked fairly well for her in the past.  Positive depression screening: Patient states that she is frustrated lately due to some minor marital issues, bowel problems as above for causing her some frustration as well. She does not think that she wants to talk about medication/counseling or anything like that at this point.  Reports fatigue but out of the ordinary for her over the past month or so.     Past medical, surgical, social and family history reviewed: Patient Active Problem List   Diagnosis Date Noted  . History of rectal fissure 12/10/2015  . History of surgical procedure 06/03/2015  . Postoperative state 04/15/2015  . 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 NSVD (normal spontaneous vaginal delivery) 12/10/2014  . Indication for care in labor or delivery 12/08/2014  . Abscess of anal and rectal regions 10/29/2014  . Fibroid uterus 08/19/2014  . Supervision of other normal pregnancy, antepartum 05/27/2014  . Cephalalgia 02/21/2013  . Abdominal bloating 02/21/2013  . Calculus of gallbladder 12/31/2008   Past Surgical History:  Procedure Laterality Date  . CHOLECYSTECTOMY    . INCISION AND DRAINAGE PERIRECTAL ABSCESS N/A 10/29/2014   Procedure: IRRIGATION AND DEBRIDEMENT PERIRECTAL ABSCESS;  Surgeon: Coralie Keens, MD;  Location: [facility];  Service: General;  Laterality: N/A;   Social History  Substance Use Topics  . Smoking status: Never Smoker   . Smokeless tobacco: Never Used  . Alcohol use No   Family History  Problem Relation Age of Onset  . Diabetes Mother      Current medication list and allergy/intolerance information reviewed:   Current Outpatient Prescriptions  Medication Sig Dispense Refill  . acetaminophen (TYLENOL) 500 MG tablet Take 500 mg by mouth every 6 (six) hours as needed.    . Ginger, Zingiber officinalis, (GINGER EXTRACT PO) Take by mouth.    Marland Kitchen ibuprofen (ADVIL,MOTRIN) 600 MG tablet Take 1 tablet (600 mg total) by mouth every 6 (six) hours. 30 tablet 0  . loperamide (IMODIUM A-D) 2 MG tablet Take 1 tablet (2 mg total) by mouth 3 (three) times daily before meals. 90 tablet 1  . ondansetron (ZOFRAN) 8 MG tablet Take 1 tablet (8 mg total) by mouth 3 (three) times daily before meals. As needed for frequent loose stools 90 tablet 1   No current facility-administered medications for this visit.    No Known Allergies    Review of Systems:  Constitutional:  No  fever, no chills, No recent illness, No unintentional weight changes. +significant fatigue.   HEENT: No  headache, no vision change, no hearing change, No sore throat, No  sinus pressure  Cardiac: No  chest pain, No  pressure, No palpitations, No  Orthopnea  Respiratory:  No  shortness of breath. No  Cough  Gastrointestinal: No  abdominal pain, No  nausea, No  vomiting,  No  blood in stool, No  diarrhea, No  constipation   Musculoskeletal: No  new myalgia/arthralgia  Genitourinary: No  incontinence, No  abnormal genital bleeding, No abnormal genital discharge  Skin: No  Rash, No other wounds/concerning lesions  Hem/Onc: No  easy bruising/bleeding, No  abnormal lymph node  Endocrine: No cold intolerance,  No heat intolerance. No polyuria/polydipsia/polyphagia   Neurologic: No  weakness, No  dizziness, No  slurred speech/focal weakness/facial droop  Psychiatric: No  concerns with depression, No  concerns with anxiety, No sleep problems, No mood  problems  Exam:  BP 100/67   Pulse 67   Ht 5\' 5"  (1.651 m)   Wt 151 lb (68.5 kg)   BMI 25.13 kg/m   Constitutional: VS see above. General Appearance: alert, well-developed, well-nourished, NAD  Eyes: Normal lids and conjunctive, non-icteric sclera  Ears, Nose, Mouth, Throat: MMM, Normal external inspection ears/nares/mouth/lips/gums. TM normal bilaterally. Pharynx/tonsils no erythema, no exudate. Nasal mucosa normal. Lazy R eye   Neck: No masses, trachea midline. No thyroid enlargement. No tenderness/mass appreciated. No lymphadenopathy  Respiratory: Normal respiratory effort. no wheeze, no rhonchi, no rales  Cardiovascular: S1/S2 normal, no murmur, no rub/gallop auscultated. RRR. No lower extremity edema.  Gastrointestinal: Nontender, no masses. No hepatomegaly, no splenomegaly. No hernia appreciated. Bowel sounds normal. Rectal exam deferred.   Musculoskeletal: Gait normal. No clubbing/cyanosis of digits.   Neurological: Normal balance/coordination. No tremor.    Skin: warm, dry, intact. No rash/ulcer.   Psychiatric: Normal judgment/insight. Normal mood and affect. Oriented x3.    ASSESSMENT/PLAN:   Annual physical exam - Plan: CBC, COMPLETE METABOLIC PANEL WITH GFR, Lipid panel, TSH  Need for immunization against influenza - Plan: Flu Vaccine QUAD 36+ mos IM  Irritable bowel syndrome, unspecified type - Plan: Tissue transglutaminase, IgA, Tissue transglutaminase, IgG, Food Allergy Panel  Fatigue, unspecified type - Plan: CBC, COMPLETE METABOLIC PANEL WITH GFR  Frequent stools - likely irritable bowel syndrome - Plan: ondansetron (ZOFRAN) 8 MG tablet  Positive depression screening - Discussed with patient, no concerns at this point, I advised talk to me in the future if want to think about counseling/medications or just to talk   [place] Updated 10/22/16   ANNUAL SCREENING/COUNSELING  Diet/Exercise - HEALTHY HABITS DISCUSSED TO DECREASE CV  RISK  NO major family or personal changes in past year  History  Smoking Status  . Never Smoker  Smokeless Tobacco  . Never Used   History  Alcohol Use No   Depression screen PHQ 2/9 10/22/2016  Decreased Interest 2  Down, Depressed, Hopeless 2  PHQ - 2 Score 4  Altered sleeping 2  Tired, decreased energy 3  Change in appetite 1  Feeling bad or failure about yourself  0  Trouble concentrating 1  Moving slowly or fidgety/restless 0  Suicidal thoughts 0  PHQ-9 Score 11    Domestic violence concerns - no  HTN SCREENING - SEE [place]  Sexually active in the past year - Yes with female.  Need/want STI testing today? - no  Concerns about libido or pain with sex? - no  Plans for pregnancy? - maybe, but not right now   INFECTIOUS DISEASE SCREENING  HIV - does not need  GC/CT - does not need  HepC - DOB [birth year] - does not need  TB - does not need  DISEASE SCREENING  Lipid - needs  DM2 - needs  Osteoporosis - women age 9+ - does not need  CANCER SCREENING  Cervical - does not need  Breast - does not need  Lung -  does not need  Colon - does not need  ADULT VACCINATION  Influenza - annual vaccine recommended  Td - booster every 10 years   Zoster - option at 4, yes at 60+   PCV13 - was not indicated  PPSV23 - was not indicated Immunization History  Administered Date(s) Administered  . Influenza Whole 04/18/2009  . Influenza,inj,Quad PF,6+ Mos 12/10/2015, 10/22/2016  . Meningococcal Polysaccharide 04/18/2009  . Td 04/18/2009  . Tdap 09/16/2014   OTHER  Fall - exercise and Vit D age 73+ - needs  Consider ASA - age 57-59 - needs     Visit summary with medication list and pertinent instructions was printed for patient to review. All questions at time of visit were answered - patient instructed to contact office with any additional concerns. ER/RTC precautions were reviewed with the patient. Follow-up plan: Return in about 4  weeks (around 11/19/2016) for recheck GI issues, sooner if needed. 1 year for annual physical .

## 2016-10-23 LAB — LIPID PANEL
CHOL/HDL RATIO: 3.4 ratio (ref <5.0)
Cholesterol: 202 mg/dL — ABNORMAL HIGH (ref <200)
HDL: 60 mg/dL (ref >50)
LDL Cholesterol: 128 mg/dL — ABNORMAL HIGH (ref <100)
Triglycerides: 68 mg/dL (ref <150)
VLDL: 14 mg/dL (ref <30)

## 2016-10-23 LAB — COMPLETE METABOLIC PANEL WITH GFR
ALBUMIN: 4.8 g/dL (ref 3.6–5.1)
ALK PHOS: 52 U/L (ref 33–115)
ALT: 12 U/L (ref 6–29)
AST: 16 U/L (ref 10–30)
BILIRUBIN TOTAL: 0.7 mg/dL (ref 0.2–1.2)
BUN: 17 mg/dL (ref 7–25)
CALCIUM: 9.3 mg/dL (ref 8.6–10.2)
CO2: 24 mmol/L (ref 20–32)
Chloride: 104 mmol/L (ref 98–110)
Creat: 0.57 mg/dL (ref 0.50–1.10)
GFR, Est African American: >89 mL/min (ref >=60)
Glucose, Bld: 89 mg/dL (ref 65–99)
POTASSIUM: 4.1 mmol/L (ref 3.5–5.3)
Sodium: 138 mmol/L (ref 135–146)
Total Protein: 7.5 g/dL (ref 6.1–8.1)

## 2016-10-23 LAB — TSH: TSH: 1.44 mIU/L

## 2016-10-25 LAB — ALLERGEN MILK

## 2016-10-25 LAB — TISSUE TRANSGLUTAMINASE, IGA: TISSUE TRANSGLUTAMINASE AB, IGA: 1 U/mL (ref <4)

## 2016-10-25 LAB — ALLERGEN, CLAM, F207: Clams: <0.1 kU/L

## 2016-10-25 LAB — ALLERGEN, WHEAT, F4: Wheat IgE: <0.1 kU/L

## 2016-10-25 LAB — ALLERGEN, SHRIMP F24: SHRIMP IGE: 0.27 kU/L — AB

## 2016-10-25 LAB — TISSUE TRANSGLUTAMINASE, IGG: Tissue Transglut Ab: 1 U/mL (ref <6)

## 2016-10-25 LAB — ALLERGEN EGG WHITE F1

## 2016-10-25 LAB — ALLERGEN, CORN F8

## 2016-10-25 LAB — ALLERGEN WALNUT F256

## 2016-10-25 LAB — ALLERGEN SOYBEAN: Soybean IgE: <0.1 kU/L

## 2016-10-25 LAB — ALLERGEN, FISH, COD F3

## 2016-10-25 LAB — ALLERGEN, PEANUT F13: Peanut IgE: <0.1 kU/L

## 2016-10-26 ENCOUNTER — Telehealth: Payer: Self-pay | Admitting: *Deleted

## 2016-10-26 NOTE — Telephone Encounter (Signed)
Error
